# Patient Record
Sex: Male | Born: 1960 | Race: White | Hispanic: No | Marital: Married | Smoking: Current every day smoker
Health system: Southern US, Community
[De-identification: ages and names within clinical notes are randomized; demographics above are authoritative.]

## PROBLEM LIST (undated history)

## (undated) DIAGNOSIS — Z720 Tobacco use: Secondary | ICD-10-CM

## (undated) DIAGNOSIS — I4891 Unspecified atrial fibrillation: Secondary | ICD-10-CM

## (undated) HISTORY — PX: CARDIOVERSION: SHX1299

## (undated) HISTORY — PX: VASECTOMY: SHX75

## (undated) HISTORY — PX: HERNIA REPAIR: SHX51

---

## 2009-01-25 ENCOUNTER — Emergency Department (HOSPITAL_COMMUNITY): Admission: EM | Admit: 2009-01-25 | Discharge: 2009-01-25 | Payer: Self-pay | Admitting: Emergency Medicine

## 2009-01-25 ENCOUNTER — Ambulatory Visit: Payer: Self-pay | Admitting: Diagnostic Radiology

## 2011-03-31 LAB — BASIC METABOLIC PANEL
BUN: 15 mg/dL (ref 6–23)
CO2: 27 mEq/L (ref 19–32)
Chloride: 107 mEq/L (ref 96–112)
Creatinine, Ser: 0.9 mg/dL (ref 0.4–1.5)
Glucose, Bld: 105 mg/dL — ABNORMAL HIGH (ref 70–99)
Potassium: 3.9 mEq/L (ref 3.5–5.1)

## 2011-03-31 LAB — D-DIMER, QUANTITATIVE: D-Dimer, Quant: <0.22 ug/mL-FEU (ref 0.00–0.48)

## 2011-03-31 LAB — DIFFERENTIAL
Basophils Absolute: 0 10*3/uL (ref 0.0–0.1)
Basophils Relative: 1 % (ref 0–1)
Eosinophils Absolute: 0.3 10*3/uL (ref 0.0–0.7)
Eosinophils Relative: 3 % (ref 0–5)
Lymphs Abs: 1.9 10*3/uL (ref 0.7–4.0)
Neutrophils Relative %: 68 % (ref 43–77)

## 2011-03-31 LAB — TSH: TSH: 1.501 u[IU]/mL (ref 0.350–4.500)

## 2011-03-31 LAB — CBC
HCT: 44.6 % (ref 39.0–52.0)
MCHC: 34.4 g/dL (ref 30.0–36.0)
MCV: 94.7 fL (ref 78.0–100.0)
Platelets: 229 10*3/uL (ref 150–400)
RDW: 12.4 % (ref 11.5–15.5)

## 2011-03-31 LAB — TROPONIN I: Troponin I: <0.01 ng/mL (ref 0.00–0.06)

## 2011-03-31 LAB — CK TOTAL AND CKMB (NOT AT ARMC): CK, MB: 1.8 ng/mL (ref 0.3–4.0)

## 2011-03-31 LAB — T3, FREE: T3, Free: 3.6 pg/mL (ref 2.3–4.2)

## 2011-03-31 LAB — POCT CARDIAC MARKERS: Troponin i, poc: <0.05 ng/mL (ref 0.00–0.09)

## 2011-12-16 ENCOUNTER — Encounter: Payer: Self-pay | Admitting: *Deleted

## 2011-12-16 ENCOUNTER — Other Ambulatory Visit: Payer: Self-pay

## 2011-12-16 ENCOUNTER — Emergency Department (HOSPITAL_COMMUNITY)
Admission: EM | Admit: 2011-12-16 | Discharge: 2011-12-16 | Disposition: A | Discharge status: Home / Self Care | Payer: Private Health Insurance - Indemnity | Attending: Emergency Medicine | Admitting: Emergency Medicine

## 2011-12-16 DIAGNOSIS — R002 Palpitations: Secondary | ICD-10-CM

## 2011-12-16 DIAGNOSIS — R000 Tachycardia, unspecified: Secondary | ICD-10-CM | POA: Insufficient documentation

## 2011-12-16 DIAGNOSIS — R0789 Other chest pain: Secondary | ICD-10-CM

## 2011-12-16 DIAGNOSIS — Z720 Tobacco use: Secondary | ICD-10-CM | POA: Diagnosis present

## 2011-12-16 DIAGNOSIS — R079 Chest pain, unspecified: Secondary | ICD-10-CM | POA: Diagnosis present

## 2011-12-16 DIAGNOSIS — I4891 Unspecified atrial fibrillation: Secondary | ICD-10-CM | POA: Diagnosis present

## 2011-12-16 HISTORY — DX: Tobacco use: Z72.0

## 2011-12-16 HISTORY — DX: Unspecified atrial fibrillation: I48.91

## 2011-12-16 LAB — DIFFERENTIAL
Basophils Absolute: 0 10*3/uL (ref 0.0–0.1)
Basophils Relative: 0 % (ref 0–1)
Lymphocytes Relative: 20 % (ref 12–46)
Monocytes Relative: 7 % (ref 3–12)
Neutro Abs: 6.6 10*3/uL (ref 1.7–7.7)
Neutrophils Relative %: 71 % (ref 43–77)

## 2011-12-16 LAB — CBC
Hemoglobin: 14.8 g/dL (ref 13.0–17.0)
MCHC: 35.2 g/dL (ref 30.0–36.0)
WBC: 9.3 10*3/uL (ref 4.0–10.5)

## 2011-12-16 LAB — BASIC METABOLIC PANEL
BUN: 10 mg/dL (ref 6–23)
CO2: 23 mEq/L (ref 19–32)
Chloride: 106 mEq/L (ref 96–112)
GFR calc Af Amer: >90 mL/min (ref >90)
Potassium: 3.7 mEq/L (ref 3.5–5.1)

## 2011-12-16 LAB — CARDIAC PANEL(CRET KIN+CKTOT+MB+TROPI)
Relative Index: 2.5 (ref 0.0–2.5)
Total CK: 102 U/L (ref 7–232)
Troponin I: <0.3 ng/mL (ref <0.30)

## 2011-12-16 MED ORDER — DILTIAZEM HCL 25 MG/5ML IV SOLN
10.0000 mg | Freq: Once | INTRAVENOUS | Status: AC
  Administered 2011-12-16: 10 mg via INTRAVENOUS
  Filled 2011-12-16: qty 5

## 2011-12-16 MED ORDER — RIVAROXABAN 10 MG PO TABS
20.0000 mg | ORAL_TABLET | Freq: Every day | ORAL | Status: DC
  Administered 2011-12-16: 20 mg via ORAL
  Filled 2011-12-16: qty 2

## 2011-12-16 MED ORDER — DILTIAZEM HCL 60 MG PO TABS
60.0000 mg | ORAL_TABLET | Freq: Three times a day (TID) | ORAL | Status: DC
  Administered 2011-12-16: 60 mg via ORAL
  Filled 2011-12-16 (×3): qty 1

## 2011-12-16 MED ORDER — DILTIAZEM HCL 25 MG/5ML IV SOLN
10.0000 mg | Freq: Once | INTRAVENOUS | Status: AC
Start: 2011-12-16 — End: 2011-12-16
  Administered 2011-12-16: 10 mg via INTRAVENOUS
  Filled 2011-12-16: qty 5

## 2011-12-16 MED ORDER — DILTIAZEM HCL 60 MG PO TABS: 60.0000 mg | ORAL_TABLET | Freq: Three times a day (TID) | ORAL | Status: DC

## 2011-12-16 MED ORDER — RIVAROXABAN 20 MG PO TABS: 20.0000 mg | ORAL_TABLET | Freq: Every day | ORAL | Status: DC

## 2011-12-16 NOTE — ED Notes (Signed)
Cardiology at bedside.

## 2011-12-16 NOTE — ED Provider Notes (Signed)
History     CSN: [id number]  Arrival date & time 12/16/11  9147   First MD Initiated Contact with Patient 12/16/11 [phone]      Chief Complaint  Patient presents with  . Chest Pain    (Consider location/radiation/quality/duration/timing/severity/associated sxs/prior treatment) HPI  H.o Afib pw cp and palpitations. Pt c/o intermittent sx x 3 days, worse this morning. Describes sudden onset chest tightness and palpitations. Denies radiation/pain. Denies n/v/diaphoresis. Denies fever/chills/cough. H/O similar requiring cardioversion per patient-- last 2 years ago. Does not take antiarrythmics or coumadin. Similar after eating large loads of sugar, caffeine. Denies chest tightness at this time. Denies h/o VTE in self or family. No recent hosp/surg/immob. No h/o cancer. Denies exogenous hormone use, no leg pain or swelling   Past Medical History  Diagnosis Date  . Campath-induced atrial fibrillation     DCCV 01/2009, not on ASA or antiarrythmic  . Tobacco abuse     Past Surgical History  Procedure Date  . Cardioversion   . Hernia repair     Family History  Problem Relation Age of Onset  . Heart attack Father 84    deceased    History  Substance Use Topics  . Smoking status: Current Everyday Smoker -- 2.0 packs/day    Types: Cigarettes  . Smokeless tobacco: Not on file  . Alcohol Use: 1.2 oz/week    2 Cans of beer per week      Review of Systems  All other systems reviewed and are negative.   except as noted HPI   Allergies  Other  Home Medications   Current Outpatient Rx  Name Route Sig Dispense Refill  . IBUPROFEN 200 MG PO TABS Oral Take 600 mg by mouth every 6 (six) hours as needed. For pain.     Marland Kitchen DILTIAZEM HCL 60 MG PO TABS Oral Take 1 tablet (60 mg total) by mouth every 8 (eight) hours. 90 tablet 0  . RIVAROXABAN 20 MG PO TABS Oral Take 20 mg by mouth daily. 30 tablet 0    BP 119/65  Pulse 122  Temp(Src) 98.8 F (37.1 C) (Oral)  Resp 18  Ht 6\' 2"   (1.88 m)  Wt 204 lb (92.534 kg)  BMI 26.19 kg/m2  SpO2 98%  Physical Exam  Nursing note and vitals reviewed. Constitutional: He is oriented to person, place, and time. He appears well-developed and well-nourished. No distress.  HENT:  Head: Atraumatic.  Mouth/Throat: Oropharynx is clear and moist.  Eyes: Conjunctivae are normal. Pupils are equal, round, and reactive to light.  Neck: Neck supple.  Cardiovascular: Normal heart sounds and intact distal pulses.  Exam reveals no gallop and no friction rub.   No murmur heard.      Tachycardic Irregularly irregular  Pulmonary/Chest: Effort normal. No respiratory distress. He has no wheezes. He has no rales.  Abdominal: Soft. Bowel sounds are normal. There is no tenderness. There is no rebound and no guarding.  Musculoskeletal: Normal range of motion. He exhibits no edema and no tenderness.  Neurological: He is alert and oriented to person, place, and time.  Skin: Skin is warm and dry.  Psychiatric: He has a normal mood and affect.    Date: 12/16/2011  Rate: 148  Rhythm: atrial fibrillation  QRS Axis: normal  Intervals: normal  ST/T Wave abnormalities: normal  Conduction Disutrbances:none  Narrative Interpretation:   Old EKG Reviewed: changes noted  ED Course  Procedures (including critical care time)  Labs Reviewed  BASIC METABOLIC PANEL -  Abnormal; Notable for the following:    Glucose, Bld 103 (*)    All other components within normal limits  CBC  DIFFERENTIAL  CARDIAC PANEL(CRET KIN+CKTOT+MB+TROPI)  MAGNESIUM  PHOSPHORUS  PROTIME-INR  APTT  TSH   No results found.   1. Atrial fibrillation with RVR   2. Chest pain     MDM  H/o Afib pw palpitations and CP. Atrial fibrillation by EKG. Given Diltiazem bolus x 2 with control of HR to 75. Labs reviewed and unremarkable including cardiac enzymes. Discussed with cardiology who will assess the patient in the Emergency department.   Patient continues to deny CP. Rate  ncreased back to 130 in the ED. D/W cardiology. Plan for rate control with PO medication, anticoagulation, and f/u outpatient TEE 2 days if can achieve rate control in the ED.  Patient with rate 106-140s after oral diltiazem. Will give IV diltiazem. Home with Oral dilt and rivaroxaban, outpatient TEE/cardioversion once rate controlled. Patient signed out to Dr. Juleen China.       Forbes Cellar, MD 12/16/11 1606

## 2011-12-16 NOTE — ED Notes (Signed)
Quintin Alto PA at [phone] at [phone].

## 2011-12-16 NOTE — ED Notes (Signed)
Family at bedside. 

## 2011-12-16 NOTE — ED Notes (Signed)
Spoke with Cardiology and EDP.  Cardiology ok to discharge patient EDP ordered one more dose of medication.  Heart rate range 105 - 130.

## 2011-12-16 NOTE — ED Notes (Signed)
Cardiology in room speaking with patient, wife, and daughter.

## 2011-12-16 NOTE — ED Notes (Signed)
MD at bedside. 

## 2011-12-16 NOTE — ED Notes (Signed)
Hr 108 post cardizem

## 2011-12-16 NOTE — ED Notes (Signed)
Heart rate 80 - 110.

## 2011-12-16 NOTE — ED Notes (Signed)
Patient reports he noted onset of tightness in his chest.  He states he did notice his heart beating irregular.  Patient has hx of cardioversion 3 years ago.  He denies sob,  Denies dizziness.

## 2011-12-16 NOTE — ED Notes (Signed)
Notified EDP patient heart rate is between 95-125.

## 2011-12-16 NOTE — ED Notes (Signed)
Patient resting comfortably on stretcher watching TV denies chest pain at this time. Family members at bedside.

## 2011-12-16 NOTE — ED Notes (Signed)
Plan by Cardiology give oral medication and continue to check vital signs. Page Cardiology 1600.

## 2011-12-16 NOTE — H&P (Addendum)
History and Physical  Patient ID: Samuel Herman MRN: [id number], SOB: 08-28-1961 51 y.o. Date of Encounter: 12/16/2011, 11:30 AM  Primary Physician: Rudi Heap MD Primary Cardiologist: Phoebe Sharps MD  Chief Complaint: Chest tightness and palpitations  HPI: 51yom w/ PMHx significant for atrial fibrillation w/ RVR s/p DCCV in 2010 who presented to [facility] on 12/16/11 with complaints of chest tightness and palpitations.  According to the patient, in February 2010 he developed palpitations/irregular heart beat while at work that was associated with chest discomfort and presented to the ED where he was found to be in atrial fibrillation w/ RVR. He was cardioverted with return to sinus rhythm and was placed on 81mg  ASA and metoprolol. He followed up with Dr. Mariah Milling two weeks later and hasn't seen cardiology or taken ASA or metoprolol since that visit.  He reports being under more than usual stress at work and feeling fatigued for the past couple of weeks to months. He was diagnosed and treated for bronchitis one month ago. Monday evening (12/14/11) he went to an oyster cookout and consumed a lot of fried foods. He reports while there he had intermittent episodes of substernal chest tightness and feeling palpitations/irregular heart beat. He also states his face was numb for about 30secs. The chest tightness wasn't made better or worse with a deep breath, there was no radiation and he had no associated shortness of breath, nausea, dizziness, presyncope, or diaphoresis. He went to bed and woke up Tuesday feeling well. Mid morning he felt similar chest tightness and palpitations that were intermittent throughout the day for which he took ASA with some relief. This morning he went to work and continued to feel tired with return of his symptoms so he presented to the Emergency Room. He smokes 2ppd, drinks 8 mountain dews per day, drinks ~2 beers/wk and denies illicit drug use. He does a lot of  walking and climbing stairs at work without chest pain, but does endorse more shortness of breath with running up stairs lately. He denies recent hospitalization, surgery, immobilization, or leg pain/swelling.  In the ED his EKG revealed Atrial Fibrillation w/ RVR 148bpm without ST/T changes. He received Diltiazem 10mg  IV x2 with subsequent heart rates 70s-110s. His chest tightness and palpitations resolved with the rate control and he is currently symptom free. Electrolytes WNL and initial cardiac enzymes negative.  Diagnosis  . Campath-induced atrial fibrillation    Surgical History:  Procedure  . Cardioversion  . Hernia repair    Home Meds: Medication Sig  ibuprofen (ADVIL,MOTRIN) 200 MG tablet Take 600 mg by mouth every 6 (six) hours as needed. For pain.    Allergies:  Allergies  Allergen Reactions  . Other Other (See Comments)    There was an antibiotic that caused hives about 20 years ago.unsure of the name.   Social History  . Marital Status: Married   Lives in [place]    Occupational History  . Tool maker   Social History Main Topics  . Smoking status: Current Everyday Smoker; 2ppd  . Alcohol Use: Yes; 2 beers/wk  . Drug Use: No    Family History: Father died of MI at 5  Review of Systems: General: negative for chills, fever, night sweats or weight changes.  Cardiovascular: As per HPI Dermatological: negative for rash Respiratory: negative for cough or wheezing Urologic: negative for hematuria Abdominal: negative for nausea, vomiting, diarrhea, bright red blood per rectum, melena, or hematemesis Neurologic: negative for visual changes, syncope,  or dizziness All other systems reviewed and are otherwise negative except as noted above.  Labs:   Lab Results  Component Value Date   WBC 9.3 12/16/2011   HGB 14.8 12/16/2011   HCT 42.1 12/16/2011   MCV 91.5 12/16/2011   PLT 201 12/16/2011    Lab 12/16/11 0849  NA 139  K 3.7  CL 106  CO2 23  BUN 10  CREATININE  0.84  CALCIUM 9.6  GLUCOSE 103*    Phosphorus 3.1  Magnesium 1.8   Basename 12/16/11 0849  CKTOTAL 102  CKMB 2.6  TROPONINI <0.30     12/16/2011 08:49  Prothrombin Time 12.9  INR 0.95    Radiology/Studies:  none   EKG: 12/16/11 @ 0842 - Atrial Fibrillation w/ RVR 148bpm, NO ST/T changes  Physical Exam: Blood pressure 101/69, pulse 75, temperature 98.8 F (37.1 C), temperature source Oral, resp. rate 14, height 6\' 2"  (1.88 m), weight 204 lb (92.534 kg), SpO2 100.00%. General: Well developed, well nourished, middle-aged male, in no acute distress. Head: Normocephalic, atraumatic, sclera non-icteric, nares are without discharge Neck: Supple. Negative for carotid bruits. JVD not elevated. Lungs: Clear bilaterally to auscultation without wheezes, rales, or rhonchi. Breathing is unlabored. Heart: Irregularly irregular. No murmurs, rubs, or gallops appreciated. Abdomen: Soft, non-tender, non-distended with normoactive bowel sounds. No hepatomegaly. No rebound/guarding. No obvious abdominal masses. Msk:  Strength and tone appear normal for age. Extremities: No clubbing or cyanosis. No edema.  Distal pedal pulses are 2+ and equal bilaterally. Neuro: Alert and oriented X 3. Moves all extremities spontaneously. Psych:  Responds to questions appropriately with a normal affect.    ASSESSMENT AND PLAN:  51yom w/ PMHx significant for atrial fibrillation w/ RVR s/p DCCV in Feb 2010 who presented to [facility] on 12/16/11 with complaints of chest tightness and palpitations. In the ED his EKG revealed Atrial Fibrillation w/ RVR 148bpm without ST/T changes.   1. Atrial Fibrillation w/ RVR: He has had increased stress as well as recent viral illness in addition to consuming large amounts of caffeine and smoking 2ppd which could have triggered this episode of a.fib. Electrolytes are WNL. He has received two doses of diltiazem with subsequent heart rates in the 70s-110s. CHADSVASC score 0.  -  Add on TSH - Diltiazem 60mg  po Q8H, if rate controlled can go home today, will cont as an outpatient through tomorrow. Hold Friday morning - Xarelto 20mg  daily, first now, continue for a total of 4 weeks - Plan for TEE and DCCV on Friday as an outpatient   2. Chest pain: His EKG is without evidence for acute ischemia and his initial cardiac enzyme is negative. His chest tightness has resolved with rate control of his a.fib. His cardiac risk factors include tobacco abuse and family history. He denies history of HTN, HLD, or DM.  3. Tobacco Abuse: Recommend smoking cessation.  Signed, HOPE, JESSICA PA-C 12/16/2011, 11:30 AM  The patient has a close to 48 hour history of tachycardia palpitations chest pain shortness of breath all of which have been alleviated by rate control despite persistence of atrial fibrillation. His thromboembolic risk profile is probably negative with a chadsVAS score of 0 for long-term anticoagulation even with aspirin is unnecessary. However, given the need for TE cardioversion because of his persistent atrial fibrillation, we will need to undertake preprocedural anticoagulation followed by TE cardioversion in 4 weeks with subsequent and coagulation.  We have discussed alternatives. We will use Rivaroxaban and we will use oral  rate control as he was quite responsive to IV diltiazem we will try it at 60 mg every 8. In the event that we can accomplish rate control orally, we will discharge him later this afternoon. I would not have him take his calcium blocker on the morning of his procedure.    I would not use long-term rate control, either beta blockers or calcium blockers, or aspirin.  He is further advised to stop smoking and to changes not due from caffeinated to decaffeinated.  We'll check a TSH. Left ventricular function process at the time of TEE.

## 2011-12-16 NOTE — ED Notes (Signed)
Pa has seen pt, waiting for Dr. Graciela Husbands to come down for plan of care.

## 2011-12-18 ENCOUNTER — Encounter (HOSPITAL_COMMUNITY)
Admission: RE | Disposition: A | Discharge status: Home / Self Care | Payer: Self-pay | Source: Ambulatory Visit | Attending: Cardiovascular Disease

## 2011-12-18 ENCOUNTER — Ambulatory Visit (HOSPITAL_COMMUNITY)
Admission: RE | Admit: 2011-12-18 | Discharge: 2011-12-18 | Disposition: A | Discharge status: Home / Self Care | Payer: Private Health Insurance - Indemnity | Source: Ambulatory Visit | Attending: Cardiovascular Disease | Admitting: Cardiovascular Disease

## 2011-12-18 ENCOUNTER — Encounter (HOSPITAL_COMMUNITY): Payer: Self-pay | Admitting: Anesthesiology

## 2011-12-18 SURGERY — ECHOCARDIOGRAM, TRANSESOPHAGEAL
Anesthesia: Moderate Sedation

## 2011-12-18 NOTE — Progress Notes (Signed)
   ELECTROPHYSIOLOGY NOTE    Patient Name: Samuel Herman Date of Encounter: 12-18-2011    SUBJECTIVE:Patient arrived today for TEE/DCCV after being on Xarelto and Diltiazem for atrial fibrillation since 12-16-2011.  On arrival, patient found to be in sinus rhythm.  Discussed with Dr Graciela Husbands.  Plan to cancel TEE/DCCV, stop Xarelto and Diltiazem and follow up in 4-6 weeks.    Appointment scheduled with Dr Graciela Husbands for 02-03-12 at [facility] [place] come and discharge patient from endo.  TELEMETRY: Reviewed telemetry pt in sinus rhythm, rate 70's  Signed, Gypsy Balsam RN, BSN 12/18/2011 10:58 AM

## 2011-12-21 ENCOUNTER — Encounter (HOSPITAL_COMMUNITY): Payer: Self-pay | Admitting: Cardiovascular Disease

## 2012-02-02 ENCOUNTER — Encounter: Payer: Self-pay | Admitting: *Deleted

## 2012-02-03 ENCOUNTER — Encounter: Payer: Self-pay | Admitting: Internal Medicine

## 2012-02-03 ENCOUNTER — Ambulatory Visit (INDEPENDENT_AMBULATORY_CARE_PROVIDER_SITE_OTHER): Payer: Private Health Insurance - Indemnity | Admitting: Internal Medicine

## 2012-02-03 ENCOUNTER — Encounter: Payer: Private Health Insurance - Indemnity | Admitting: Internal Medicine

## 2012-02-03 VITALS — BP 109/71 | HR 70 | Resp 18 | Ht 74.0 in | Wt 203.8 lb

## 2012-02-03 DIAGNOSIS — Z720 Tobacco use: Secondary | ICD-10-CM

## 2012-02-03 DIAGNOSIS — I4891 Unspecified atrial fibrillation: Secondary | ICD-10-CM

## 2012-02-03 DIAGNOSIS — F17200 Nicotine dependence, unspecified, uncomplicated: Secondary | ICD-10-CM

## 2012-02-03 DIAGNOSIS — F439 Reaction to severe stress, unspecified: Secondary | ICD-10-CM

## 2012-02-03 DIAGNOSIS — Z733 Stress, not elsewhere classified: Secondary | ICD-10-CM

## 2012-02-03 NOTE — Progress Notes (Signed)
  HPI  Samuel Herman is a 51 y.o. male See in followup for atrial fibrillation. This was first identified in 2010 at which time he went cardioversion. Had recurrent atrial fibrillation in early January of this year in the context of an antecedent illness and caffeine. He had persistence of atrial fibrillation  He underwent TE guided cardioversion with post procedural anticoagulation. He has been maintaining sinus rhythm.  CHADS-VASc score is 0  Visit he brought up the issue of stress. In taking stress home to his wife. And I raised the question as to whether he considered himself an angry man. We discussed whether he had nocturnal sleep disturbance. He discussed how stressful work is. Past Medical History  Diagnosis Date  . Atrial fibrillation     DCCV 01/2009, not on ASA or antiarrythmic  . Tobacco abuse     Past Surgical History  Procedure Date  . Cardioversion   . Hernia repair   . Tee without cardioversion 12/18/2011    Procedure: TRANSESOPHAGEAL ECHOCARDIOGRAM (TEE);  Surgeon: Wendall Stade, MD;  Location: [facility] ENDOSCOPY;  Service: Cardiovascular;  Laterality: N/A;  . Cardioversion 12/18/2011    Procedure: CARDIOVERSION;  Surgeon: Wendall Stade, MD;  Location: [facility] ENDOSCOPY;  Service: Cardiovascular;  Laterality: N/A;    Current Outpatient Prescriptions  Medication Sig Dispense Refill  . ibuprofen (ADVIL,MOTRIN) 200 MG tablet Take 600 mg by mouth every 6 (six) hours as needed. For pain.         Allergies  Allergen Reactions  . Other Other (See Comments)    There was an antibiotic that caused hives about 20 years ago.unsure of the name.    Review of Systems negative except from HPI and PMH  Physical Exam There were no vitals taken for this visit. Well developed and well nourished in no acute distress HENT normal E scleral and icterus clear Neck Supple JVP flat; carotids brisk and full Clear to ausculation Regular rate and rhythm, no murmurs gallops or rub Soft with  active bowel sounds No clubbing cyanosis none Edema Alert and oriented, grossly normal motor and sensory function Skin Warm and Dry  Sinus rhythm at 68 Intervals 0.15/0.09/0.39 Axis is 55   Assessment and  Plan

## 2012-02-03 NOTE — Assessment & Plan Note (Signed)
He asked for nicotine patches. I told him I would prescribe him. He left without the prescription.

## 2012-02-03 NOTE — Assessment & Plan Note (Signed)
We had a 30 minute discussion regarding stress and its relationship to anxiety anger and depression. I suggested that he discuss with his family whether these are issues and if so to pursue relief in the form of talking with his PCP regarding antidepressants and considering some counseling.

## 2012-02-03 NOTE — Assessment & Plan Note (Signed)
Holding sinus rhythm. CHADS-VASc score 0 no anticoagulation

## 2014-05-17 ENCOUNTER — Encounter: Payer: Self-pay | Admitting: Family Medicine

## 2014-05-17 ENCOUNTER — Ambulatory Visit (INDEPENDENT_AMBULATORY_CARE_PROVIDER_SITE_OTHER): Payer: BC Managed Care – HMO | Admitting: Family Medicine

## 2014-05-17 VITALS — BP 113/65 | HR 70 | Temp 98.7°F | Ht 72.0 in | Wt 205.4 lb

## 2014-05-17 DIAGNOSIS — M25512 Pain in left shoulder: Secondary | ICD-10-CM

## 2014-05-17 DIAGNOSIS — M25519 Pain in unspecified shoulder: Secondary | ICD-10-CM

## 2014-05-17 MED ORDER — NAPROXEN 500 MG PO TABS: 500.0000 mg | ORAL_TABLET | Freq: Two times a day (BID) | ORAL | Status: DC

## 2014-05-17 MED ORDER — HYDROCODONE-ACETAMINOPHEN 5-325 MG PO TABS: 1.0000 | ORAL_TABLET | Freq: Four times a day (QID) | ORAL | Status: DC | PRN

## 2014-05-17 NOTE — Progress Notes (Signed)
   Subjective:    Patient ID: Samuel Herman, male    DOB: 10/22/1961, 53 y.o.   MRN: [id number]  HPI This 53 y.o. male presents for evaluation of left upper back discomfort.  He states his wife has massaged this area and it has helped some.  He c/o moderate pain in his left mid back radiating up to His neck and he did this lifting and pulling.  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   Review of Systems C/o neck and back pain No chest pain, SOB, HA, dizziness, vision change, N/V, diarrhea, constipation, dysuria, urinary urgency or frequency or rash.     Objective:   Physical Exam  Vital signs noted  Well developed well nourished male.  HEENT - Head atraumatic Normocephalic Respiratory - Lungs CTA bilateral Cardiac - RRR S1 and S2 without murmur GI - Abdomen soft Nontender and bowel sounds active x 4 MS - TTP left scapula region to left trapezius      Assessment & Plan:  Trigger point of left shoulder region - Plan: naproxen (NAPROSYN) 500 MG tablet, HYDROcodone-acetaminophen (NORCO) 5-325 MG per tablet,  Apply heating pad and follow up prn if not better.  Deatra Canter FNP

## 2014-06-13 ENCOUNTER — Encounter: Payer: Self-pay | Admitting: Physician Assistant

## 2014-06-13 ENCOUNTER — Encounter (INDEPENDENT_AMBULATORY_CARE_PROVIDER_SITE_OTHER): Payer: Self-pay

## 2014-06-13 ENCOUNTER — Ambulatory Visit (INDEPENDENT_AMBULATORY_CARE_PROVIDER_SITE_OTHER): Payer: BC Managed Care – HMO | Admitting: Physician Assistant

## 2014-06-13 VITALS — BP 112/78 | HR 76 | Temp 98.3°F | Ht 73.0 in | Wt 201.4 lb

## 2014-06-13 DIAGNOSIS — J40 Bronchitis, not specified as acute or chronic: Secondary | ICD-10-CM

## 2014-06-13 MED ORDER — AZITHROMYCIN 250 MG PO TABS: ORAL_TABLET | ORAL | Status: DC

## 2014-06-13 NOTE — Progress Notes (Signed)
Subjective:     Patient ID: Samuel Herman, male   DOB: Oct 27, 1961, 53 y.o.   MRN: [id number]  HPI Pt with continued prod cough and congestion Has used multiple OTC meds but sx cont  Review of Systems  Constitutional: Positive for chills, appetite change and fatigue.  HENT: Positive for congestion, postnasal drip, rhinorrhea, sinus pressure and sore throat. Negative for sneezing.   Eyes: Negative.   Respiratory: Positive for cough, chest tightness and wheezing. Negative for apnea, choking, shortness of breath and stridor.   Cardiovascular: Negative.        Objective:   Physical Exam  Nursing note and vitals reviewed. Constitutional: He appears well-developed and well-nourished.  HENT:  Right Ear: External ear normal.  Left Ear: External ear normal.  Mouth/Throat: Oropharynx is clear and moist.  Neck: Normal range of motion. Neck supple. No JVD present.  Cardiovascular: Normal rate, regular rhythm and normal heart sounds.   Pulmonary/Chest: Effort normal. He has wheezes.  Lymphadenopathy:    He has no cervical adenopathy.       Assessment:     Bronchitis    Plan:     Smoking cessation Fluids Rest Zpack OTC meds for congestion F/U prn

## 2014-06-13 NOTE — Patient Instructions (Signed)
Smoking Cessation Quitting smoking is important to your health and has many advantages. However, it is not always easy to quit since nicotine is a very addictive drug. Often times, people try 3 times or more before being able to quit. This document explains the best ways for you to prepare to quit smoking. Quitting takes hard work and a lot of effort, but you can do it. ADVANTAGES OF QUITTING SMOKING  You will live longer, feel better, and live better.  Your body will feel the impact of quitting smoking almost immediately.  Within 20 minutes, blood pressure decreases. Your pulse returns to its normal level.  After 8 hours, carbon monoxide levels in the blood return to normal. Your oxygen level increases.  After 24 hours, the chance of having a heart attack starts to decrease. Your breath, hair, and body stop smelling like smoke.  After 48 hours, damaged nerve endings begin to recover. Your sense of taste and smell improve.  After 72 hours, the body is virtually free of nicotine. Your bronchial tubes relax and breathing becomes easier.  After 2 to 12 weeks, lungs can hold more air. Exercise becomes easier and circulation improves.  The risk of having a heart attack, stroke, cancer, or lung disease is greatly reduced.  After 1 year, the risk of coronary heart disease is cut in half.  After 5 years, the risk of stroke falls to the same as a nonsmoker.  After 10 years, the risk of lung cancer is cut in half and the risk of other cancers decreases significantly.  After 15 years, the risk of coronary heart disease drops, usually to the level of a nonsmoker.  If you are pregnant, quitting smoking will improve your chances of having a healthy baby.  The people you live with, especially any children, will be healthier.  You will have extra money to spend on things other than cigarettes. QUESTIONS TO THINK ABOUT BEFORE ATTEMPTING TO QUIT You may want to talk about your answers with your  caregiver.  Why do you want to quit?  If you tried to quit in the past, what helped and what did not?  What will be the most difficult situations for you after you quit? How will you plan to handle them?  Who can help you through the tough times? Your family? Friends? A caregiver?  What pleasures do you get from smoking? What ways can you still get pleasure if you quit? Here are some questions to ask your caregiver:  How can you help me to be successful at quitting?  What medicine do you think would be best for me and how should I take it?  What should I do if I need more help?  What is smoking withdrawal like? How can I get information on withdrawal? GET READY  Set a quit date.  Change your environment by getting rid of all cigarettes, ashtrays, matches, and lighters in your home, car, or work. Do not let people smoke in your home.  Review your past attempts to quit. Think about what worked and what did not. GET SUPPORT AND ENCOURAGEMENT You have a better chance of being successful if you have help. You can get support in many ways.  Tell your family, friends, and co-workers that you are going to quit and need their support. Ask them not to smoke around you.  Get individual, group, or telephone counseling and support. Programs are available at local hospitals and health centers. Call your local health department for   information about programs in your area.  Spiritual beliefs and practices may help some smokers quit.  Download a "quit meter" on your computer to keep track of quit statistics, such as how long you have gone without smoking, cigarettes not smoked, and money saved.  Get a self-help book about quitting smoking and staying off of tobacco. LEARN NEW SKILLS AND BEHAVIORS  Distract yourself from urges to smoke. Talk to someone, go for a walk, or occupy your time with a task.  Change your normal routine. Take a different route to work. Drink tea instead of coffee.  Eat breakfast in a different place.  Reduce your stress. Take a hot bath, exercise, or read a book.  Plan something enjoyable to do every day. Reward yourself for not smoking.  Explore interactive web-based programs that specialize in helping you quit. GET MEDICINE AND USE IT CORRECTLY Medicines can help you stop smoking and decrease the urge to smoke. Combining medicine with the above behavioral methods and support can greatly increase your chances of successfully quitting smoking.  Nicotine replacement therapy helps deliver nicotine to your body without the negative effects and risks of smoking. Nicotine replacement therapy includes nicotine gum, lozenges, inhalers, nasal sprays, and skin patches. Some may be available over-the-counter and others require a prescription.  Antidepressant medicine helps people abstain from smoking, but how this works is unknown. This medicine is available by prescription.  Nicotinic receptor partial agonist medicine simulates the effect of nicotine in your brain. This medicine is available by prescription. Ask your caregiver for advice about which medicines to use and how to use them based on your health history. Your caregiver will tell you what side effects to look out for if you choose to be on a medicine or therapy. Carefully read the information on the package. Do not use any other product containing nicotine while using a nicotine replacement product.  RELAPSE OR DIFFICULT SITUATIONS Most relapses occur within the first 3 months after quitting. Do not be discouraged if you start smoking again. Remember, most people try several times before finally quitting. You may have symptoms of withdrawal because your body is used to nicotine. You may crave cigarettes, be irritable, feel very hungry, cough often, get headaches, or have difficulty concentrating. The withdrawal symptoms are only temporary. They are strongest when you first quit, but they will go away within  10-14 days. To reduce the chances of relapse, try to:  Avoid drinking alcohol. Drinking lowers your chances of successfully quitting.  Reduce the amount of caffeine you consume. Once you quit smoking, the amount of caffeine in your body increases and can give you symptoms, such as a rapid heartbeat, sweating, and anxiety.  Avoid smokers because they can make you want to smoke.  Do not let weight gain distract you. Many smokers will gain weight when they quit, usually less than 10 pounds. Eat a healthy diet and stay active. You can always lose the weight gained after you quit.  Find ways to improve your mood other than smoking. FOR MORE INFORMATION  www.smokefree.gov  Document Released: 11/24/2001 Document Revised: 05/31/2012 Document Reviewed: 03/10/2012 ExitCare Patient Information 2015 [employer]. This information is not intended to replace advice given to you by your health care provider. Make sure you discuss any questions you have with your health care provider.  

## 2014-09-07 ENCOUNTER — Telehealth: Payer: Self-pay | Admitting: Family Medicine

## 2014-09-07 NOTE — Telephone Encounter (Signed)
appt given for in the am  

## 2014-09-08 ENCOUNTER — Ambulatory Visit (INDEPENDENT_AMBULATORY_CARE_PROVIDER_SITE_OTHER): Payer: BC Managed Care – HMO | Admitting: Family Medicine

## 2014-09-08 VITALS — BP 115/71 | HR 77 | Temp 97.0°F | Ht 73.0 in | Wt 205.8 lb

## 2014-09-08 DIAGNOSIS — H02829 Cysts of unspecified eye, unspecified eyelid: Secondary | ICD-10-CM

## 2014-09-08 DIAGNOSIS — H02822 Cysts of right lower eyelid: Secondary | ICD-10-CM

## 2014-09-08 DIAGNOSIS — G501 Atypical facial pain: Secondary | ICD-10-CM

## 2014-09-08 MED ORDER — DOXYCYCLINE HYCLATE 100 MG PO TABS: 100.0000 mg | ORAL_TABLET | Freq: Two times a day (BID) | ORAL | Status: DC

## 2014-09-08 NOTE — Progress Notes (Signed)
   Subjective:    Patient ID: Samuel Herman, male    DOB: 1961-11-01, 53 y.o.   MRN: [id number]  HPI C/o cyst that is painful under his right eye.  He has had similar cysts on his right cheek that he had removed by a surgeon across from [facility]. He states he wants the cyst to be popped today because it is painful.   Review of Systems C/o cyst below right eye. No chest pain, SOB, HA, dizziness, vision change, N/V, diarrhea, constipation, dysuria, urinary urgency or frequency, myalgias, arthralgias or rash.     Objective:   Physical Exam  Vital signs noted  Well developed well nourished male.  HEENT - Head atraumatic Normocephalic                Eyes - right peri orbital area with cyst right lower eye area that is erythematous and tender. Respiratory - Lungs CTA bilateral Cardiac - RRR S1 and S2 without murmur GI - Abdomen soft Nontender and bowel sounds active x 4 Extremities - No edema. Neuro - Grossly intact.  Procedure - cyst prepped with etoh and then #18 needle is used to stab cyst and purulent serous sanguin drainage drained and then small cyst remains.  Patient states pain is gone.    Assessment & Plan:  Cyst of right lower eyelid - Plan: Ambulatory referral to General Surgery Doxycycline  one po bid x 10 days Simple incision and drainage procedure to relieve pain accomplished  Deatra Canter FNP

## 2014-09-13 ENCOUNTER — Telehealth: Payer: Self-pay | Admitting: Family Medicine

## 2014-10-01 ENCOUNTER — Telehealth: Payer: Self-pay

## 2014-10-01 NOTE — Telephone Encounter (Signed)
Wants an ENT referral

## 2014-10-02 ENCOUNTER — Other Ambulatory Visit: Payer: Self-pay | Admitting: Family Medicine

## 2014-10-02 DIAGNOSIS — J0100 Acute maxillary sinusitis, unspecified: Secondary | ICD-10-CM

## 2014-10-05 ENCOUNTER — Other Ambulatory Visit: Payer: Self-pay | Admitting: Family Medicine

## 2014-12-11 ENCOUNTER — Ambulatory Visit (INDEPENDENT_AMBULATORY_CARE_PROVIDER_SITE_OTHER): Payer: BC Managed Care – HMO | Admitting: Physician Assistant

## 2014-12-11 ENCOUNTER — Encounter: Payer: Self-pay | Admitting: Physician Assistant

## 2014-12-11 VITALS — BP 117/74 | HR 70 | Temp 97.1°F | Ht 73.0 in | Wt 205.8 lb

## 2014-12-11 DIAGNOSIS — J209 Acute bronchitis, unspecified: Secondary | ICD-10-CM

## 2014-12-11 MED ORDER — ALBUTEROL SULFATE HFA 108 (90 BASE) MCG/ACT IN AERS: 2.0000 | INHALATION_SPRAY | Freq: Four times a day (QID) | RESPIRATORY_TRACT | Status: DC | PRN

## 2014-12-11 MED ORDER — AZITHROMYCIN 250 MG PO TABS: ORAL_TABLET | ORAL | Status: DC

## 2014-12-11 MED ORDER — METHYLPREDNISOLONE ACETATE 80 MG/ML IJ SUSP
80.0000 mg | Freq: Once | INTRAMUSCULAR | Status: AC
  Administered 2014-12-11: 80 mg via INTRAMUSCULAR

## 2014-12-11 NOTE — Patient Instructions (Signed)

## 2014-12-11 NOTE — Progress Notes (Signed)
Subjective:     Patient ID: Samuel Herman, male   DOB: 1961/02/29, 53 y.o.   MRN: [id number]  HPI Pt with cough, congestion, and wheezing for several day + smoking hx  Review of Systems  Constitutional: Positive for fever, chills, activity change, appetite change and fatigue.  HENT: Positive for congestion, postnasal drip and sinus pressure.   Respiratory: Positive for choking and wheezing.        Objective:   Physical Exam  Constitutional: He appears well-developed and well-nourished.  HENT:  Right Ear: External ear normal.  Left Ear: External ear normal.  Mouth/Throat: Oropharynx is clear and moist. No oropharyngeal exudate.  Neck: Neck supple.  Cardiovascular: Normal rate, regular rhythm and normal heart sounds.   No murmur heard. Pulmonary/Chest: Effort normal. He has wheezes.  Lymphadenopathy:    He has no cervical adenopathy.  Nursing note and vitals reviewed.      Assessment:     Bronchitis    Plan:     Stressed smoking cessation Fluids Rest Zpack pack Alb Inhaler OTC meds for sx  F/U prn

## 2014-12-27 ENCOUNTER — Encounter (HOSPITAL_COMMUNITY): Payer: Self-pay | Admitting: Cardiovascular Disease

## 2015-04-16 ENCOUNTER — Encounter: Payer: Self-pay | Admitting: Nurse Practitioner

## 2015-04-16 ENCOUNTER — Ambulatory Visit (INDEPENDENT_AMBULATORY_CARE_PROVIDER_SITE_OTHER): Payer: BLUE CROSS/BLUE SHIELD | Admitting: Nurse Practitioner

## 2015-04-16 VITALS — BP 113/78 | HR 77 | Temp 98.3°F | Ht 73.0 in | Wt 202.0 lb

## 2015-04-16 DIAGNOSIS — R5383 Other fatigue: Secondary | ICD-10-CM

## 2015-04-16 DIAGNOSIS — T148 Other injury of unspecified body region: Secondary | ICD-10-CM | POA: Diagnosis not present

## 2015-04-16 DIAGNOSIS — W57XXXA Bitten or stung by nonvenomous insect and other nonvenomous arthropods, initial encounter: Secondary | ICD-10-CM

## 2015-04-16 MED ORDER — DOXYCYCLINE HYCLATE 100 MG PO TABS: 100.0000 mg | ORAL_TABLET | Freq: Two times a day (BID) | ORAL | Status: DC

## 2015-04-16 NOTE — Progress Notes (Signed)
   Subjective:    Patient ID: Samuel Herman, male    DOB: Nov 10, 1961, 54 y.o.   MRN: [id number]  HPI Patient comes in with his wife c/o fatigue that started several days ago- he is under more stress at work but does not feel that that is the cause. He is usually very active when he comes home from work but last several days he has no energy to do anything. He has a history of atrial fib but that was several years ago. He does say that he has gotten several ticks off of himself over the last several weeks.    Review of Systems  Constitutional: Positive for fatigue. Negative for fever, chills and appetite change.  HENT: Negative.   Respiratory: Negative.   Cardiovascular: Negative for chest pain, palpitations and leg swelling.  Genitourinary: Negative.   Neurological: Negative.   Psychiatric/Behavioral: Negative.        Objective:   Physical Exam  Constitutional: He is oriented to person, place, and time. He appears well-developed and well-nourished. No distress.  HENT:  Right Ear: External ear normal.  Left Ear: External ear normal.  Nose: Nose normal.  Mouth/Throat: Oropharynx is clear and moist.  Eyes: Pupils are equal, round, and reactive to light.  Neck: Normal range of motion. Neck supple.  Cardiovascular: Normal rate, regular rhythm and normal heart sounds.   Pulmonary/Chest: Effort normal and breath sounds normal.  Neurological: He is alert and oriented to person, place, and time.  Skin: Skin is warm and dry.  Psychiatric: He has a normal mood and affect. His behavior is normal. Judgment and thought content normal.   BP 113/78 mmHg  Pulse 77  Temp(Src) 98.3 F (36.8 C) (Oral)  Ht 6\' 1"  (1.854 m)  Wt 202 lb (91.627 kg)  BMI 26.66 kg/m2        Assessment & Plan:   1. Other fatigue   2. Tick bite    Meds ordered this encounter  Medications  . doxycycline (VIBRA-TABS) 100 MG tablet    Sig: Take 1 tablet (100 mg total) by mouth 2 (two) times daily. 1 po bid      Dispense:  28 tablet    Refill:  0    Order Specific Question:  Supervising Provider    Answer:  Ernestina PennaMOORE, DONALD W [[id number]]   Labs pending If does not improve will run more test  Mary-Margaret Daphine DeutscherMartin, FNP

## 2015-04-16 NOTE — Patient Instructions (Signed)
Tick Bite Information Ticks are insects that attach themselves to the skin and draw blood for food. There are various types of ticks. Common types include wood ticks and deer ticks. Most ticks live in shrubs and grassy areas. Ticks can climb onto your body when you make contact with leaves or grass where the tick is waiting. The most common places on the body for ticks to attach themselves are the scalp, neck, armpits, waist, and groin. Most tick bites are harmless, but sometimes ticks carry germs that cause diseases. These germs can be spread to a person during the tick's feeding process. The chance of a disease spreading through a tick bite depends on:   The type of tick.  Time of year.   How long the tick is attached.   Geographic location.  HOW CAN YOU PREVENT TICK BITES? Take these steps to help prevent tick bites when you are outdoors:  Wear protective clothing. Long sleeves and long pants are best.   Wear white clothes so you can see ticks more easily.  Tuck your pant legs into your socks.   If walking on a trail, stay in the middle of the trail to avoid brushing against bushes.  Avoid walking through areas with long grass.  Put insect repellent on all exposed skin and along boot tops, pant legs, and sleeve cuffs.   Check clothing, hair, and skin repeatedly and before going inside.   Brush off any ticks that are not attached.  Take a shower or bath as soon as possible after being outdoors.  WHAT IS THE PROPER WAY TO REMOVE A TICK? Ticks should be removed as soon as possible to help prevent diseases caused by tick bites. 1. If latex gloves are available, put them on before trying to remove a tick.  2. Using fine-point tweezers, grasp the tick as close to the skin as possible. You may also use curved forceps or a tick removal tool. Grasp the tick as close to its head as possible. Avoid grasping the tick on its body. 3. Pull gently with steady upward pressure until  the tick lets go. Do not twist the tick or jerk it suddenly. This may break off the tick's head or mouth parts. 4. Do not squeeze or crush the tick's body. This could force disease-carrying fluids from the tick into your body.  5. After the tick is removed, wash the bite area and your hands with soap and water or other disinfectant such as alcohol. 6. Apply a small amount of antiseptic cream or ointment to the bite site.  7. Wash and disinfect any instruments that were used.  Do not try to remove a tick by applying a hot match, petroleum jelly, or fingernail polish to the tick. These methods do not work and may increase the chances of disease being spread from the tick bite.  WHEN SHOULD YOU SEEK MEDICAL CARE? Contact your health care provider if you are unable to remove a tick from your skin or if a part of the tick breaks off and is stuck in the skin.  After a tick bite, you need to be aware of signs and symptoms that could be related to diseases spread by ticks. Contact your health care provider if you develop any of the following in the days or weeks after the tick bite:  Unexplained fever.  Rash. A circular rash that appears days or weeks after the tick bite may indicate the possibility of Lyme disease. The rash may resemble   a target with a bull's-eye and may occur at a different part of your body than the tick bite.  Redness and swelling in the area of the tick bite.   Tender, swollen lymph glands.   Diarrhea.   Weight loss.   Cough.   Fatigue.   Muscle, joint, or bone pain.   Abdominal pain.   Headache.   Lethargy or a change in your level of consciousness.  Difficulty walking or moving your legs.   Numbness in the legs.   Paralysis.  Shortness of breath.   Confusion.   Repeated vomiting.  Document Released: 11/27/2000 Document Revised: 09/20/2013 Document Reviewed: 05/10/2013 ExitCare Patient Information 2015 [employer]. This information is  not intended to replace advice given to you by your health care provider. Make sure you discuss any questions you have with your health care provider.  

## 2015-04-19 ENCOUNTER — Telehealth: Payer: Self-pay | Admitting: Family Medicine

## 2015-04-19 LAB — LYME AB/WESTERN BLOT REFLEX
LYME DISEASE AB, QUANT, IGM: <0.8 index (ref 0.00–0.79)
Lyme IgG/IgM Ab: <0.91 {ISR} (ref 0.00–0.90)

## 2015-04-19 LAB — ROCKY MTN SPOTTED FVR ABS PNL(IGG+IGM)
RMSF IgG: POSITIVE — AB
RMSF IgM: 0.27 index (ref 0.00–0.89)

## 2015-04-19 LAB — RMSF, IGG, IFA

## 2015-04-19 NOTE — Telephone Encounter (Signed)
Patient aware results are still not back.

## 2015-04-24 ENCOUNTER — Other Ambulatory Visit: Payer: Self-pay | Admitting: *Deleted

## 2015-04-24 DIAGNOSIS — A770 Spotted fever due to Rickettsia rickettsii: Secondary | ICD-10-CM

## 2015-04-30 ENCOUNTER — Other Ambulatory Visit (INDEPENDENT_AMBULATORY_CARE_PROVIDER_SITE_OTHER): Payer: BLUE CROSS/BLUE SHIELD

## 2015-04-30 DIAGNOSIS — A770 Spotted fever due to Rickettsia rickettsii: Secondary | ICD-10-CM

## 2015-04-30 NOTE — Progress Notes (Signed)
Lab only 

## 2015-05-03 LAB — ROCKY MTN SPOTTED FVR ABS PNL(IGG+IGM)
RMSF IGG: POSITIVE — AB
RMSF IGM: 0.25 {index} (ref 0.00–0.89)

## 2015-05-03 LAB — RMSF, IGG, IFA: RMSF, IGG, IFA: 1:128 {titer} — ABNORMAL HIGH

## 2015-05-07 ENCOUNTER — Telehealth: Payer: Self-pay | Admitting: Family Medicine

## 2015-05-07 ENCOUNTER — Telehealth: Payer: Self-pay | Admitting: Physician Assistant

## 2015-05-07 NOTE — Telephone Encounter (Signed)
Spoke with patient.

## 2015-10-16 ENCOUNTER — Encounter: Payer: Self-pay | Admitting: Family Medicine

## 2015-10-16 ENCOUNTER — Ambulatory Visit (INDEPENDENT_AMBULATORY_CARE_PROVIDER_SITE_OTHER): Payer: BLUE CROSS/BLUE SHIELD | Admitting: Family Medicine

## 2015-10-16 VITALS — BP 124/80 | HR 82 | Temp 97.6°F | Ht 73.0 in | Wt 197.0 lb

## 2015-10-16 DIAGNOSIS — Z0000 Encounter for general adult medical examination without abnormal findings: Secondary | ICD-10-CM

## 2015-10-16 DIAGNOSIS — Z13220 Encounter for screening for lipoid disorders: Secondary | ICD-10-CM

## 2015-10-16 DIAGNOSIS — Z131 Encounter for screening for diabetes mellitus: Secondary | ICD-10-CM

## 2015-10-16 DIAGNOSIS — Z125 Encounter for screening for malignant neoplasm of prostate: Secondary | ICD-10-CM

## 2015-10-17 NOTE — Progress Notes (Signed)
BP 124/80 mmHg  Pulse 82  Temp(Src) 97.6 F (36.4 C) (Oral)  Ht 6' 1"  (1.854 m)  Wt 197 lb (89.359 kg)  BMI 26.00 kg/m2   Subjective:    Patient ID: Samuel Herman, male    DOB: [birth year], 54 y.o.   MRN: [id number]  HPI: Samuel Herman is a 54 y.o. male presenting on 10/16/2015 for Wellness fitness exam physical   HPI  Well exam Patient comes in today for routine well adult exam. Patient denies headaches, blurred vision, chest pains, shortness of breath, or weakness. Denies any issues with bowel or bladder problems.  Wants to do screening labs.  Relevant past medical, surgical, family and social history reviewed and updated as indicated. Interim medical history since our last visit reviewed. Allergies and medications reviewed and updated.  Review of Systems  Constitutional: Negative for fever and chills.  HENT: Negative for congestion, ear discharge and ear pain.   Eyes: Negative for discharge and visual disturbance.  Respiratory: Negative for chest tightness, shortness of breath and wheezing.   Cardiovascular: Negative for chest pain, palpitations and leg swelling.  Gastrointestinal: Negative for abdominal pain, diarrhea and constipation.  Genitourinary: Negative for difficulty urinating.  Musculoskeletal: Negative for back pain and gait problem.  Skin: Negative for rash.  Neurological: Negative for dizziness, syncope, light-headedness and headaches.  All other systems reviewed and are negative.   Per HPI unless specifically indicated above     Medication List    Notice  As of 10/16/2015 11:59 PM   You have not been prescribed any medications.         Objective:    BP 124/80 mmHg  Pulse 82  Temp(Src) 97.6 F (36.4 C) (Oral)  Ht 6' 1"  (1.854 m)  Wt 197 lb (89.359 kg)  BMI 26.00 kg/m2  Wt Readings from Last 3 Encounters:  10/16/15 197 lb (89.359 kg)  04/16/15 202 lb (91.627 kg)  12/11/14 205 lb 12.8 oz (93.35 kg)    Physical Exam  Constitutional: He  is oriented to person, place, and time. He appears well-developed and well-nourished. No distress.  HENT:  Right Ear: External ear normal.  Left Ear: External ear normal.  Nose: Nose normal.  Mouth/Throat: Oropharynx is clear and moist. No oropharyngeal exudate.  Eyes: Conjunctivae and EOM are normal. Pupils are equal, round, and reactive to light. Right eye exhibits no discharge. No scleral icterus.  Neck: Neck supple. No thyromegaly present.  Cardiovascular: Normal rate, regular rhythm, normal heart sounds and intact distal pulses.   No murmur heard. Pulmonary/Chest: Effort normal and breath sounds normal. No respiratory distress. He has no wheezes.  Abdominal: Soft. Bowel sounds are normal. He exhibits no distension. There is no tenderness. There is no rebound.  Genitourinary: Rectum normal and prostate normal.  Musculoskeletal: Normal range of motion. He exhibits no edema.  Lymphadenopathy:    He has no cervical adenopathy.  Neurological: He is alert and oriented to person, place, and time. Coordination normal.  Skin: Skin is warm and dry. No rash noted. He is not diaphoretic.  Psychiatric: He has a normal mood and affect. His behavior is normal.  Vitals reviewed.   Results for orders placed or performed in visit on 04/30/15  Rocky mtn spotted fvr abs pnl(IgG+IgM)  Result Value Ref Range   RMSF IgG Positive (A) Negative   RMSF IgM 0.25 0.00 - 0.89 index  RMSF, IgG, IFA  Result Value Ref Range   RMSF, IGG, IFA 1:128 (H) Neg <1:64  Assessment & Plan:   Problem List Items Addressed This Visit    None    Visit Diagnoses    Well adult exam    -  Primary    Screening, lipid        Relevant Orders    Lipid panel    Screening for diabetes mellitus        Relevant Orders    CMP14+EGFR    Screening for prostate cancer        Relevant Orders    PSA, total and free        Follow up plan: Return in about 1 year (around 10/15/2016), or if symptoms worsen or fail to  improve.  Caryl Pina, MD [facility] 10/16/2015, 5:02 PM

## 2015-11-08 ENCOUNTER — Other Ambulatory Visit (INDEPENDENT_AMBULATORY_CARE_PROVIDER_SITE_OTHER): Payer: BLUE CROSS/BLUE SHIELD

## 2015-11-08 DIAGNOSIS — Z131 Encounter for screening for diabetes mellitus: Secondary | ICD-10-CM

## 2015-11-08 DIAGNOSIS — Z125 Encounter for screening for malignant neoplasm of prostate: Secondary | ICD-10-CM

## 2015-11-08 DIAGNOSIS — Z13220 Encounter for screening for lipoid disorders: Secondary | ICD-10-CM

## 2015-11-08 NOTE — Progress Notes (Signed)
Lab only 

## 2015-11-09 LAB — PSA, TOTAL AND FREE
PROSTATE SPECIFIC AG, SERUM: 0.7 ng/mL (ref 0.0–4.0)
PSA FREE: 0.42 ng/mL
PSA, Free Pct: 60 %

## 2015-11-09 LAB — CMP14+EGFR
A/G RATIO: 1.7 (ref 1.1–2.5)
ALT: 17 IU/L (ref 0–44)
AST: 20 IU/L (ref 0–40)
Albumin: 4.2 g/dL (ref 3.5–5.5)
Alkaline Phosphatase: 52 IU/L (ref 39–117)
BUN / CREAT RATIO: 16 (ref 9–20)
BUN: 14 mg/dL (ref 6–24)
Bilirubin Total: 0.5 mg/dL (ref 0.0–1.2)
CALCIUM: 9.5 mg/dL (ref 8.7–10.2)
CO2: 23 mmol/L (ref 18–29)
Chloride: 104 mmol/L (ref 97–106)
Creatinine, Ser: 0.9 mg/dL (ref 0.76–1.27)
GFR, EST AFRICAN AMERICAN: 111 mL/min/{1.73_m2} (ref >59)
GFR, EST NON AFRICAN AMERICAN: 96 mL/min/{1.73_m2} (ref >59)
GLUCOSE: 98 mg/dL (ref 65–99)
Globulin, Total: 2.5 g/dL (ref 1.5–4.5)
POTASSIUM: 4.3 mmol/L (ref 3.5–5.2)
Sodium: 142 mmol/L (ref 136–144)
TOTAL PROTEIN: 6.7 g/dL (ref 6.0–8.5)

## 2015-11-09 LAB — LIPID PANEL
CHOL/HDL RATIO: 3.4 ratio (ref 0.0–5.0)
Cholesterol, Total: 109 mg/dL (ref 100–199)
HDL: 32 mg/dL — AB (ref >39)
LDL Calculated: 58 mg/dL (ref 0–99)
Triglycerides: 96 mg/dL (ref 0–149)
VLDL CHOLESTEROL CAL: 19 mg/dL (ref 5–40)

## 2015-11-11 ENCOUNTER — Telehealth: Payer: Self-pay | Admitting: Family Medicine

## 2015-11-11 NOTE — Telephone Encounter (Signed)
Pt aware of labs  

## 2015-12-31 ENCOUNTER — Ambulatory Visit (INDEPENDENT_AMBULATORY_CARE_PROVIDER_SITE_OTHER): Payer: BLUE CROSS/BLUE SHIELD | Admitting: Pediatrics

## 2015-12-31 VITALS — BP 121/88 | HR 71 | Temp 97.3°F | Ht 73.0 in | Wt 210.6 lb

## 2015-12-31 DIAGNOSIS — R062 Wheezing: Secondary | ICD-10-CM

## 2015-12-31 DIAGNOSIS — R6889 Other general symptoms and signs: Secondary | ICD-10-CM | POA: Diagnosis not present

## 2015-12-31 DIAGNOSIS — J019 Acute sinusitis, unspecified: Secondary | ICD-10-CM

## 2015-12-31 DIAGNOSIS — Z720 Tobacco use: Secondary | ICD-10-CM

## 2015-12-31 LAB — POCT INFLUENZA A/B
Influenza A, POC: NEGATIVE
Influenza B, POC: NEGATIVE

## 2015-12-31 MED ORDER — DOXYCYCLINE HYCLATE 100 MG PO TABS: 100.0000 mg | ORAL_TABLET | Freq: Two times a day (BID) | ORAL | Status: DC

## 2015-12-31 MED ORDER — SPACER/AERO CHAMBER MOUTHPIECE MISC: Status: DC

## 2015-12-31 MED ORDER — ALBUTEROL SULFATE HFA 108 (90 BASE) MCG/ACT IN AERS: 2.0000 | INHALATION_SPRAY | Freq: Four times a day (QID) | RESPIRATORY_TRACT | Status: DC | PRN

## 2015-12-31 NOTE — Progress Notes (Signed)
Subjective:    Patient ID: Samuel Herman, male    DOB: 1961-01-15, 55 y.o.   MRN: [id number]  CC: Fever; Cough; Ear Pain; and Nasal Congestion   HPI: Samuel Herman is a 55 y.o. male presenting for Fever; Cough; Ear Pain; and Nasal Congestion  Feels good in the morning, has lots of drainage in the morning Taking robutussin, nyquil, nasal spray Started getting sick one week ago, was around grandaughter who had pink eye Took some extra strength tylenol today Drinking lots of water Current smoker Felt feverish this morning, slightly worse than day before   Depression screen [facility] 2/9 12/31/2015 10/16/2015 04/16/2015 05/17/2014  Decreased Interest 0 0 0 0  Down, Depressed, Hopeless 0 0 0 0  PHQ - 2 Score 0 0 0 0     Relevant past medical, surgical, family and social history reviewed and updated as indicated. Interim medical history since our last visit reviewed. Allergies and medications reviewed and updated.    ROS: Per HPI unless specifically indicated above  History  Smoking status  . Current Every Day Smoker -- 0.50 packs/day  . Types: Cigarettes  Smokeless tobacco  . Not on file    Past Medical History Patient Active Problem List   Diagnosis Date Noted  . Stress 02/03/2012  . Atrial fibrillation with RVR (HCC) 12/16/2011  . Tobacco abuse 12/16/2011  . Chest pain 12/16/2011    Current Outpatient Prescriptions  Medication Sig Dispense Refill  . albuterol (PROVENTIL HFA;VENTOLIN HFA) 108 (90 Base) MCG/ACT inhaler Inhale 2 puffs into the lungs every 6 (six) hours as needed for wheezing or shortness of breath. 1 Inhaler 2  . doxycycline (VIBRA-TABS) 100 MG tablet Take 1 tablet (100 mg total) by mouth 2 (two) times daily. 14 tablet 0  . Spacer/Aero Chamber Mouthpiece MISC Please dispense one spacer for use with inhaler. 1 each 0   No current facility-administered medications for this visit.       Objective:    BP 121/88 mmHg  Pulse 71  Temp(Src) 97.3 F  (36.3 C) (Oral)  Ht  (1.854 m)  Wt 210 lb 9.6 oz (95.528 kg)  BMI 27.79 kg/m2  Wt Readings from Last 3 Encounters:  12/31/15 210 lb 9.6 oz (95.528 kg)  10/16/15 197 lb (89.359 kg)  04/16/15 202 lb (91.627 kg)     Gen: NAD, alert, cooperative with exam, NCAT EYES: EOMI, no scleral injection or icterus ENT:  TMs pearly gray b/l, OP without erythema, some pressure in sinuses when he leans forward, no tenderness over sinuses b/l LYMPH: no cervical LAD CV: NRRR, normal S1/S2, no murmur, distal pulses 2+ b/l Resp: moving air well, wheezing b/l with expiration, prolonged exp phase.  normal WOB Abd: +BS, soft, NTND. no guarding or organomegaly Ext: No edema, warm Neuro: Alert and oriented MSK: normal muscle bulk     Assessment & Plan:    Samuel Herman was seen today for wheezing, acute sinusitis. Flu test was negative. Will treat with albuterol TID, doxycycline. Discussed sinus rinses to help with nasal congestion, pt says he is not likely to try. Minimal dry cough now, no increase in sputum production, mostly bothered by nasal congestion.  Diagnoses and all orders for this visit:  Flu-like symptoms -     POCT Influenza A/B  Acute sinusitis, recurrence not specified, unspecified location -     doxycycline (VIBRA-TABS) 100 MG tablet; Take 1 tablet (100 mg total) by mouth 2 (two) times daily.  Wheezing -  albuterol (PROVENTIL HFA;VENTOLIN HFA) 108 (90 Base) MCG/ACT inhaler; Inhale 2 puffs into the lungs every 6 (six) hours as needed for wheezing or shortness of breath. -     Spacer/Aero Chamber Mouthpiece MISC; Please dispense one spacer for use with inhaler. -     doxycycline (VIBRA-TABS) 100 MG tablet; Take 1 tablet (100 mg total) by mouth 2 (two) times daily.   Follow up plan: Return if symptoms worsen or fail to improve.  Samuel Kras, MD [facility] 12/31/2015, 6:40 PM

## 2015-12-31 NOTE — Patient Instructions (Signed)
Albuterol three times a day with spacer for next 3-4 days  Doxycycline twice a day for 7 days

## 2016-07-08 ENCOUNTER — Ambulatory Visit (INDEPENDENT_AMBULATORY_CARE_PROVIDER_SITE_OTHER): Payer: BLUE CROSS/BLUE SHIELD | Admitting: Family Medicine

## 2016-07-08 ENCOUNTER — Encounter: Payer: Self-pay | Admitting: Family Medicine

## 2016-07-08 VITALS — BP 119/72 | HR 96 | Temp 100.5°F | Ht 73.0 in | Wt 199.0 lb

## 2016-07-08 DIAGNOSIS — R062 Wheezing: Secondary | ICD-10-CM | POA: Diagnosis not present

## 2016-07-08 DIAGNOSIS — J209 Acute bronchitis, unspecified: Secondary | ICD-10-CM

## 2016-07-08 MED ORDER — ALBUTEROL SULFATE HFA 108 (90 BASE) MCG/ACT IN AERS: 2.0000 | INHALATION_SPRAY | Freq: Four times a day (QID) | RESPIRATORY_TRACT | 2 refills | Status: DC | PRN

## 2016-07-08 MED ORDER — PREDNISONE 20 MG PO TABS: ORAL_TABLET | ORAL | 0 refills | Status: DC

## 2016-07-08 MED ORDER — DOXYCYCLINE HYCLATE 100 MG PO TABS: 100.0000 mg | ORAL_TABLET | Freq: Two times a day (BID) | ORAL | 0 refills | Status: DC

## 2016-07-08 NOTE — Progress Notes (Signed)
BP 119/72 (BP Location: Left Arm, Patient Position: Sitting, Cuff Size: Large)   Pulse 96   Temp (!) 100.5 F (38.1 C) (Oral)   Ht 6\' 1"  (1.854 m)   Wt 199 lb (90.3 kg)   SpO2 97%   BMI 26.25 kg/m    Subjective:    Patient ID: Samuel Herman, male    DOB: 1961/08/25, 55 y.o.   MRN: [id number]  HPI: Samuel Herman is a 55 y.o. male presenting on 07/08/2016 for Cough (began 2 weeks ago, after going on a camping trip where he was out in the rain and riding 4 wheelers); Bronchitis (chest congestion); and Tick bites (had several tick bites while he was out camping, has been aching all over)   HPI Cough and congestion and wheezing Patient has been having cough and congestion and wheezing that has been increasing over the past week. He denies any fevers or chills but has felt flushed afterwards had some coughing and wheezing episodes. He denies any sick contacts that he knows of. He is still smoking about a pack per day and has no intentions of quitting. This is his second episode of this this year.  Relevant past medical, surgical, family and social history reviewed and updated as indicated. Interim medical history since our last visit reviewed. Allergies and medications reviewed and updated.  Review of Systems  Constitutional: Negative for fever.  HENT: Positive for congestion, sinus pressure and sore throat. Negative for ear discharge, ear pain and rhinorrhea.   Eyes: Negative for discharge and visual disturbance.  Respiratory: Positive for cough and wheezing. Negative for shortness of breath.   Cardiovascular: Negative for chest pain and leg swelling.  Gastrointestinal: Negative for abdominal pain, constipation and diarrhea.  Genitourinary: Negative for difficulty urinating.  Musculoskeletal: Negative for back pain and gait problem.  Skin: Negative for rash.  Neurological: Negative for syncope, light-headedness and headaches.  All other systems reviewed and are  negative.   Per HPI unless specifically indicated above     Medication List       Accurate as of 07/08/16  4:39 PM. Always use your most recent med list.          albuterol 108 (90 Base) MCG/ACT inhaler Commonly known as:  PROVENTIL HFA;VENTOLIN HFA Inhale 2 puffs into the lungs every 6 (six) hours as needed for wheezing or shortness of breath.   doxycycline 100 MG tablet Commonly known as:  VIBRA-TABS Take 1 tablet (100 mg total) by mouth 2 (two) times daily.   guaiFENesin-dextromethorphan 100-10 MG/5ML syrup Commonly known as:  ROBITUSSIN DM Take 10 mLs by mouth every 4 (four) hours as needed for cough.   ibuprofen 600 MG tablet Commonly known as:  ADVIL,MOTRIN Take 600 mg by mouth every 6 (six) hours as needed.   predniSONE 20 MG tablet Commonly known as:  DELTASONE 2 po at same time daily for 5 days   Spacer/Aero Chamber Mouthpiece Misc Please dispense one spacer for use with inhaler.          Objective:    BP 119/72 (BP Location: Left Arm, Patient Position: Sitting, Cuff Size: Large)   Pulse 96   Temp (!) 100.5 F (38.1 C) (Oral)   Ht 6\' 1"  (1.854 m)   Wt 199 lb (90.3 kg)   SpO2 97%   BMI 26.25 kg/m   Wt Readings from Last 3 Encounters:  07/08/16 199 lb (90.3 kg)  12/31/15 210 lb 9.6 oz (95.5 kg)  10/16/15 197  lb (89.4 kg)    Physical Exam  Constitutional: He is oriented to person, place, and time. He appears well-developed and well-nourished. No distress.  HENT:  Right Ear: Tympanic membrane, external ear and ear canal normal.  Left Ear: Tympanic membrane, external ear and ear canal normal.  Nose: Mucosal edema and rhinorrhea present. No sinus tenderness. No epistaxis. Right sinus exhibits maxillary sinus tenderness. Right sinus exhibits no frontal sinus tenderness. Left sinus exhibits maxillary sinus tenderness. Left sinus exhibits no frontal sinus tenderness.  Mouth/Throat: Uvula is midline and mucous membranes are normal. Posterior oropharyngeal  edema and posterior oropharyngeal erythema present. No oropharyngeal exudate or tonsillar abscesses.  Eyes: Conjunctivae and EOM are normal. Pupils are equal, round, and reactive to light. Right eye exhibits no discharge. No scleral icterus.  Neck: Neck supple. No thyromegaly present.  Cardiovascular: Normal rate, regular rhythm, normal heart sounds and intact distal pulses.   No murmur heard. Pulmonary/Chest: Effort normal. No respiratory distress. He has wheezes. He has no rales. He exhibits no tenderness.  Musculoskeletal: Normal range of motion. He exhibits no edema.  Lymphadenopathy:    He has no cervical adenopathy.  Neurological: He is alert and oriented to person, place, and time. Coordination normal.  Skin: Skin is warm and dry. No rash noted. He is not diaphoretic.  Psychiatric: He has a normal mood and affect. His behavior is normal.  Nursing note and vitals reviewed.     Assessment & Plan:   Problem List Items Addressed This Visit      Other   Wheezing - Primary   Relevant Medications   doxycycline (VIBRA-TABS) 100 MG tablet   albuterol (PROVENTIL HFA;VENTOLIN HFA) 108 (90 Base) MCG/ACT inhaler   predniSONE (DELTASONE) 20 MG tablet    Other Visit Diagnoses    Acute bronchitis, unspecified organism       Relevant Medications   doxycycline (VIBRA-TABS) 100 MG tablet   albuterol (PROVENTIL HFA;VENTOLIN HFA) 108 (90 Base) MCG/ACT inhaler   predniSONE (DELTASONE) 20 MG tablet       Follow up plan: Return if symptoms worsen or fail to improve.  Counseling provided for all of the vaccine components No orders of the defined types were placed in this encounter.   Arville Care, MD [facility] 07/08/2016, 4:39 PM

## 2016-11-02 ENCOUNTER — Encounter: Payer: BLUE CROSS/BLUE SHIELD | Admitting: Family Medicine

## 2016-11-11 ENCOUNTER — Encounter: Payer: BLUE CROSS/BLUE SHIELD | Admitting: Physician Assistant

## 2016-11-16 ENCOUNTER — Encounter: Payer: Self-pay | Admitting: Family Medicine

## 2016-11-16 ENCOUNTER — Ambulatory Visit (INDEPENDENT_AMBULATORY_CARE_PROVIDER_SITE_OTHER): Payer: BLUE CROSS/BLUE SHIELD | Admitting: Family Medicine

## 2016-11-16 VITALS — BP 123/78 | HR 74 | Temp 98.4°F | Ht 73.0 in | Wt 206.0 lb

## 2016-11-16 DIAGNOSIS — Z0000 Encounter for general adult medical examination without abnormal findings: Secondary | ICD-10-CM | POA: Diagnosis not present

## 2016-11-16 DIAGNOSIS — Z716 Tobacco abuse counseling: Secondary | ICD-10-CM

## 2016-11-16 MED ORDER — VARENICLINE TARTRATE 1 MG PO TABS: 1.0000 mg | ORAL_TABLET | Freq: Two times a day (BID) | ORAL | 0 refills | Status: DC

## 2016-11-16 MED ORDER — VARENICLINE TARTRATE 0.5 MG X 11 & 1 MG X 42 PO MISC: ORAL | 0 refills | Status: DC

## 2016-11-16 NOTE — Progress Notes (Signed)
BP 123/78   Pulse 74   Temp 98.4 F (36.9 C) (Oral)   Ht 6' 1"  (1.854 m)   Wt 206 lb (93.4 kg)   BMI 27.18 kg/m    Subjective:    Patient ID: Samuel Herman, male    DOB: Jul 09, 1961, 55 y.o.   MRN: [id number]  HPI: Samuel Herman is a 55 y.o. male presenting on 11/16/2016 for Annual Exam   HPI Well adult exam Patient is coming in for well adult exam today. He is also coming in for routine fasting labs. He denies any major health issues currently. He has not experienced any more episodes of palpitations. He denies any chest pain, shortness of breath, headaches or vision issues, abdominal complaints, diarrhea, nausea, vomiting, or joint issues. He is still smoking but would like to try something to try to quit smoking. He has tried Chantix once before would like to try again.  Relevant past medical, surgical, family and social history reviewed and updated as indicated. Interim medical history since our last visit reviewed. Allergies and medications reviewed and updated.  Review of Systems  Constitutional: Negative for chills and fever.  HENT: Negative for ear pain and tinnitus.   Eyes: Negative for pain and discharge.  Respiratory: Negative for cough, shortness of breath and wheezing.   Cardiovascular: Negative for chest pain, palpitations and leg swelling.  Gastrointestinal: Negative for abdominal pain, blood in stool, constipation and diarrhea.  Genitourinary: Negative for dysuria and hematuria.  Musculoskeletal: Negative for back pain, gait problem and myalgias.  Skin: Negative for rash.  Neurological: Negative for dizziness, weakness and headaches.  Psychiatric/Behavioral: Negative for suicidal ideas.  All other systems reviewed and are negative.   Per HPI unless specifically indicated above     Medication List       Accurate as of 11/16/16  5:26 PM. Always use your most recent med list.          albuterol 108 (90 Base) MCG/ACT inhaler Commonly known as:   PROVENTIL HFA;VENTOLIN HFA Inhale 2 puffs into the lungs every 6 (six) hours as needed for wheezing or shortness of breath.   ibuprofen 600 MG tablet Commonly known as:  ADVIL,MOTRIN Take 600 mg by mouth every 6 (six) hours as needed.   Spacer/Aero Chamber Mouthpiece Misc Please dispense one spacer for use with inhaler.   varenicline 0.5 MG X 11 & 1 MG X 42 tablet Commonly known as:  CHANTIX STARTING MONTH PAK Take one 0.5 mg tablet by mouth daily for 3 days, then one 0.5 mg tablet twice daily for 4 days, then one 1 mg tablet twice daily.   varenicline 1 MG tablet Commonly known as:  CHANTIX CONTINUING MONTH PAK Take 1 tablet (1 mg total) by mouth 2 (two) times daily.          Objective:    BP 123/78   Pulse 74   Temp 98.4 F (36.9 C) (Oral)   Ht 6' 1"  (1.854 m)   Wt 206 lb (93.4 kg)   BMI 27.18 kg/m   Wt Readings from Last 3 Encounters:  11/16/16 206 lb (93.4 kg)  07/08/16 199 lb (90.3 kg)  12/31/15 210 lb 9.6 oz (95.5 kg)    Physical Exam  Constitutional: He is oriented to person, place, and time. He appears well-developed and well-nourished. No distress.  HENT:  Right Ear: External ear normal.  Left Ear: External ear normal.  Nose: Nose normal.  Mouth/Throat: Oropharynx is clear and moist. No  oropharyngeal exudate.  Eyes: Conjunctivae and EOM are normal. Pupils are equal, round, and reactive to light. Right eye exhibits no discharge. No scleral icterus.  Neck: Neck supple. No thyromegaly present.  Cardiovascular: Normal rate, regular rhythm, normal heart sounds and intact distal pulses.   No murmur heard. Pulmonary/Chest: Effort normal and breath sounds normal. No respiratory distress. He has no wheezes.  Abdominal: Soft. Bowel sounds are normal. He exhibits no distension. There is no tenderness. There is no rebound and no guarding.  Musculoskeletal: Normal range of motion. He exhibits no edema.  Lymphadenopathy:    He has no cervical adenopathy.  Neurological:  He is alert and oriented to person, place, and time. Coordination normal.  Skin: Skin is warm and dry. No rash noted. He is not diaphoretic.  Psychiatric: He has a normal mood and affect. His behavior is normal.  Vitals reviewed.     Assessment & Plan:   Problem List Items Addressed This Visit    None    Visit Diagnoses    Well adult exam    -  Primary   Relevant Orders   CMP14+EGFR   Lipid panel   PSA, total and free   Encounter for smoking cessation counseling       Relevant Medications   varenicline (CHANTIX STARTING MONTH PAK) 0.5 MG X 11 & 1 MG X 42 tablet   varenicline (CHANTIX CONTINUING MONTH PAK) 1 MG tablet      Follow up plan: Return in about 1 year (around 11/16/2017), or if symptoms worsen or fail to improve.  Counseling provided for all of the vaccine components Orders Placed This Encounter  Procedures  . CMP14+EGFR  . Lipid panel  . PSA, total and free    Caryl Pina, MD [facility] 11/16/2016, 5:26 PM

## 2016-11-28 ENCOUNTER — Other Ambulatory Visit: Payer: BLUE CROSS/BLUE SHIELD

## 2016-11-28 DIAGNOSIS — Z0000 Encounter for general adult medical examination without abnormal findings: Secondary | ICD-10-CM

## 2016-11-29 LAB — CMP14+EGFR
ALBUMIN: 4.4 g/dL (ref 3.5–5.5)
ALK PHOS: 57 IU/L (ref 39–117)
ALT: 20 IU/L (ref 0–44)
AST: 19 IU/L (ref 0–40)
Albumin/Globulin Ratio: 1.7 (ref 1.2–2.2)
BILIRUBIN TOTAL: 0.7 mg/dL (ref 0.0–1.2)
BUN / CREAT RATIO: 13 (ref 9–20)
BUN: 13 mg/dL (ref 6–24)
CHLORIDE: 104 mmol/L (ref 96–106)
CO2: 22 mmol/L (ref 18–29)
Calcium: 9.7 mg/dL (ref 8.7–10.2)
Creatinine, Ser: 0.98 mg/dL (ref 0.76–1.27)
GFR calc Af Amer: 99 mL/min/{1.73_m2} (ref >59)
GFR calc non Af Amer: 86 mL/min/{1.73_m2} (ref >59)
GLOBULIN, TOTAL: 2.6 g/dL (ref 1.5–4.5)
GLUCOSE: 98 mg/dL (ref 65–99)
Potassium: 4.6 mmol/L (ref 3.5–5.2)
SODIUM: 143 mmol/L (ref 134–144)
Total Protein: 7 g/dL (ref 6.0–8.5)

## 2016-11-29 LAB — LIPID PANEL
CHOLESTEROL TOTAL: 111 mg/dL (ref 100–199)
Chol/HDL Ratio: 3.1 ratio units (ref 0.0–5.0)
HDL: 36 mg/dL — ABNORMAL LOW (ref >39)
LDL Calculated: 57 mg/dL (ref 0–99)
Triglycerides: 92 mg/dL (ref 0–149)
VLDL Cholesterol Cal: 18 mg/dL (ref 5–40)

## 2016-11-29 LAB — PSA, TOTAL AND FREE
PSA FREE PCT: 51.3 %
PSA FREE: 0.41 ng/mL
Prostate Specific Ag, Serum: 0.8 ng/mL (ref 0.0–4.0)

## 2016-12-03 ENCOUNTER — Encounter: Payer: Self-pay | Admitting: *Deleted

## 2017-11-18 ENCOUNTER — Encounter: Payer: Self-pay | Admitting: Family Medicine

## 2017-11-18 ENCOUNTER — Ambulatory Visit (INDEPENDENT_AMBULATORY_CARE_PROVIDER_SITE_OTHER): Payer: BLUE CROSS/BLUE SHIELD | Admitting: Family Medicine

## 2017-11-18 VITALS — BP 124/78 | HR 76 | Temp 97.1°F | Ht 73.0 in | Wt 208.0 lb

## 2017-11-18 DIAGNOSIS — Z13220 Encounter for screening for lipoid disorders: Secondary | ICD-10-CM

## 2017-11-18 DIAGNOSIS — Z0000 Encounter for general adult medical examination without abnormal findings: Secondary | ICD-10-CM

## 2017-11-18 DIAGNOSIS — Z125 Encounter for screening for malignant neoplasm of prostate: Secondary | ICD-10-CM

## 2017-11-18 DIAGNOSIS — Z114 Encounter for screening for human immunodeficiency virus [HIV]: Secondary | ICD-10-CM

## 2017-11-18 DIAGNOSIS — Z131 Encounter for screening for diabetes mellitus: Secondary | ICD-10-CM

## 2017-11-18 DIAGNOSIS — Z1159 Encounter for screening for other viral diseases: Secondary | ICD-10-CM

## 2017-11-18 NOTE — Progress Notes (Signed)
 BP 124/78   Pulse 76   Temp (!) 97.1 F (36.2 C) (Oral)   Ht 6' 1" (1.854 m)   Wt 208 lb (94.3 kg)   BMI 27.44 kg/m    Subjective:    Patient ID: Samuel Herman, male    DOB: 03/06/1961, 56 y.o.   MRN: [id number]  HPI: Samuel Herman is a 56 y.o. male presenting on 11/18/2017 for Annual Exam   HPI Adult well exam and physical Patient is coming in today for adult well exam and physical and yearly labs.  He denies any major health issues except for the occasional sciatica which she has dealt with.  He denies any breathing issues or urinary issues.  He is still trying to work on quitting smoking and his wife recently got him some Chantix to try but he has not try to get. Patient denies any chest pain, shortness of breath, headaches or vision issues, abdominal complaints, diarrhea, nausea, vomiting, or joint issues.   Relevant past medical, surgical, family and social history reviewed and updated as indicated. Interim medical history since our last visit reviewed. Allergies and medications reviewed and updated.  Review of Systems  Constitutional: Negative for chills and fever.  HENT: Negative for ear pain and tinnitus.   Eyes: Negative for pain and discharge.  Respiratory: Negative for cough, shortness of breath and wheezing.   Cardiovascular: Negative for chest pain, palpitations and leg swelling.  Gastrointestinal: Negative for abdominal pain, blood in stool, constipation and diarrhea.  Genitourinary: Negative for dysuria and hematuria.  Musculoskeletal: Negative for back pain, gait problem and myalgias.  Skin: Negative for rash.  Neurological: Negative for dizziness, weakness and headaches.  Psychiatric/Behavioral: Negative for suicidal ideas.  All other systems reviewed and are negative.   Per HPI unless specifically indicated above        Objective:    BP 124/78   Pulse 76   Temp (!) 97.1 F (36.2 C) (Oral)   Ht 6' 1" (1.854 m)   Wt 208 lb (94.3 kg)   BMI  27.44 kg/m   Wt Readings from Last 3 Encounters:  11/18/17 208 lb (94.3 kg)  11/16/16 206 lb (93.4 kg)  07/08/16 199 lb (90.3 kg)    Physical Exam  Constitutional: He is oriented to person, place, and time. He appears well-developed and well-nourished. No distress.  HENT:  Right Ear: External ear normal.  Left Ear: External ear normal.  Nose: Nose normal.  Mouth/Throat: Oropharynx is clear and moist. No oropharyngeal exudate.  Eyes: Conjunctivae and EOM are normal. Pupils are equal, round, and reactive to light. Right eye exhibits no discharge. No scleral icterus.  Neck: Neck supple. No thyromegaly present.  Cardiovascular: Normal rate, regular rhythm, normal heart sounds and intact distal pulses.  No murmur heard. Pulmonary/Chest: Effort normal and breath sounds normal. No respiratory distress. He has no wheezes.  Abdominal: Soft. Bowel sounds are normal. He exhibits no distension. There is no tenderness. There is no rebound and no guarding.  Musculoskeletal: Normal range of motion. He exhibits no edema.  Lymphadenopathy:    He has no cervical adenopathy.  Neurological: He is alert and oriented to person, place, and time. Coordination normal.  Skin: Skin is warm and dry. No rash noted. He is not diaphoretic.  Psychiatric: He has a normal mood and affect. His behavior is normal.  Nursing note and vitals reviewed.       Assessment & Plan:   Problem List Items Addressed This Visit      None    Visit Diagnoses    Well adult exam    -  Primary   Relevant Orders   CMP14+EGFR   Lipid panel   HIV antibody   Hepatitis C antibody   PSA, total and free   Prostate cancer screening       Relevant Orders   PSA, total and free   Lipid screening       Relevant Orders   Lipid panel   Screening for HIV without presence of risk factors       Relevant Orders   HIV antibody   Need for hepatitis C screening test       Relevant Orders   Hepatitis C antibody   Screening for diabetes  mellitus       Relevant Orders   CMP14+EGFR       Follow up plan: Return in about 1 year (around 11/18/2018), or if symptoms worsen or fail to improve.  Counseling provided for all of the vaccine components Orders Placed This Encounter  Procedures  . CMP14+EGFR  . Lipid panel  . HIV antibody  . Hepatitis C antibody  . PSA, total and free     , MD [facility] 11/18/2017, 2:43 PM     

## 2017-11-19 LAB — HIV ANTIBODY (ROUTINE TESTING W REFLEX): HIV SCREEN 4TH GENERATION: NONREACTIVE

## 2017-11-19 LAB — LIPID PANEL
CHOL/HDL RATIO: 3.7 ratio (ref 0.0–5.0)
Cholesterol, Total: 107 mg/dL (ref 100–199)
HDL: 29 mg/dL — AB (ref >39)
LDL CALC: 44 mg/dL (ref 0–99)
Triglycerides: 172 mg/dL — ABNORMAL HIGH (ref 0–149)
VLDL CHOLESTEROL CAL: 34 mg/dL (ref 5–40)

## 2017-11-19 LAB — CMP14+EGFR
ALBUMIN: 4.5 g/dL (ref 3.5–5.5)
ALT: 16 IU/L (ref 0–44)
AST: 21 IU/L (ref 0–40)
Albumin/Globulin Ratio: 2.1 (ref 1.2–2.2)
Alkaline Phosphatase: 53 IU/L (ref 39–117)
BILIRUBIN TOTAL: 0.8 mg/dL (ref 0.0–1.2)
BUN / CREAT RATIO: 12 (ref 9–20)
BUN: 15 mg/dL (ref 6–24)
CALCIUM: 9.3 mg/dL (ref 8.7–10.2)
CHLORIDE: 103 mmol/L (ref 96–106)
CO2: 22 mmol/L (ref 20–29)
CREATININE: 1.3 mg/dL — AB (ref 0.76–1.27)
GFR, EST AFRICAN AMERICAN: 70 mL/min/{1.73_m2} (ref >59)
GFR, EST NON AFRICAN AMERICAN: 61 mL/min/{1.73_m2} (ref >59)
GLUCOSE: 79 mg/dL (ref 65–99)
Globulin, Total: 2.1 g/dL (ref 1.5–4.5)
Potassium: 4.5 mmol/L (ref 3.5–5.2)
Sodium: 142 mmol/L (ref 134–144)
TOTAL PROTEIN: 6.6 g/dL (ref 6.0–8.5)

## 2017-11-19 LAB — PSA, TOTAL AND FREE
PSA, Free Pct: 55.7 %
PSA, Free: 0.39 ng/mL
Prostate Specific Ag, Serum: 0.7 ng/mL (ref 0.0–4.0)

## 2017-11-19 LAB — HEPATITIS C ANTIBODY

## 2017-11-24 ENCOUNTER — Other Ambulatory Visit: Payer: Self-pay | Admitting: *Deleted

## 2017-11-24 DIAGNOSIS — R7989 Other specified abnormal findings of blood chemistry: Secondary | ICD-10-CM

## 2017-12-15 DIAGNOSIS — M9904 Segmental and somatic dysfunction of sacral region: Secondary | ICD-10-CM | POA: Diagnosis not present

## 2017-12-15 DIAGNOSIS — M9903 Segmental and somatic dysfunction of lumbar region: Secondary | ICD-10-CM | POA: Diagnosis not present

## 2017-12-15 DIAGNOSIS — M9902 Segmental and somatic dysfunction of thoracic region: Secondary | ICD-10-CM | POA: Diagnosis not present

## 2017-12-15 DIAGNOSIS — M5136 Other intervertebral disc degeneration, lumbar region: Secondary | ICD-10-CM | POA: Diagnosis not present

## 2017-12-20 DIAGNOSIS — M9904 Segmental and somatic dysfunction of sacral region: Secondary | ICD-10-CM | POA: Diagnosis not present

## 2017-12-20 DIAGNOSIS — M9902 Segmental and somatic dysfunction of thoracic region: Secondary | ICD-10-CM | POA: Diagnosis not present

## 2017-12-20 DIAGNOSIS — M5136 Other intervertebral disc degeneration, lumbar region: Secondary | ICD-10-CM | POA: Diagnosis not present

## 2017-12-20 DIAGNOSIS — M9903 Segmental and somatic dysfunction of lumbar region: Secondary | ICD-10-CM | POA: Diagnosis not present

## 2017-12-23 DIAGNOSIS — M9903 Segmental and somatic dysfunction of lumbar region: Secondary | ICD-10-CM | POA: Diagnosis not present

## 2017-12-23 DIAGNOSIS — M9902 Segmental and somatic dysfunction of thoracic region: Secondary | ICD-10-CM | POA: Diagnosis not present

## 2017-12-23 DIAGNOSIS — M5136 Other intervertebral disc degeneration, lumbar region: Secondary | ICD-10-CM | POA: Diagnosis not present

## 2017-12-23 DIAGNOSIS — M9904 Segmental and somatic dysfunction of sacral region: Secondary | ICD-10-CM | POA: Diagnosis not present

## 2017-12-27 DIAGNOSIS — M9903 Segmental and somatic dysfunction of lumbar region: Secondary | ICD-10-CM | POA: Diagnosis not present

## 2017-12-27 DIAGNOSIS — M9902 Segmental and somatic dysfunction of thoracic region: Secondary | ICD-10-CM | POA: Diagnosis not present

## 2017-12-27 DIAGNOSIS — M9904 Segmental and somatic dysfunction of sacral region: Secondary | ICD-10-CM | POA: Diagnosis not present

## 2017-12-27 DIAGNOSIS — M5136 Other intervertebral disc degeneration, lumbar region: Secondary | ICD-10-CM | POA: Diagnosis not present

## 2017-12-28 DIAGNOSIS — M5033 Other cervical disc degeneration, cervicothoracic region: Secondary | ICD-10-CM | POA: Diagnosis not present

## 2017-12-28 DIAGNOSIS — M5030 Other cervical disc degeneration, unspecified cervical region: Secondary | ICD-10-CM | POA: Insufficient documentation

## 2017-12-30 DIAGNOSIS — M9902 Segmental and somatic dysfunction of thoracic region: Secondary | ICD-10-CM | POA: Diagnosis not present

## 2017-12-30 DIAGNOSIS — M9903 Segmental and somatic dysfunction of lumbar region: Secondary | ICD-10-CM | POA: Diagnosis not present

## 2017-12-30 DIAGNOSIS — M9904 Segmental and somatic dysfunction of sacral region: Secondary | ICD-10-CM | POA: Diagnosis not present

## 2017-12-30 DIAGNOSIS — M5136 Other intervertebral disc degeneration, lumbar region: Secondary | ICD-10-CM | POA: Diagnosis not present

## 2018-01-06 DIAGNOSIS — M5136 Other intervertebral disc degeneration, lumbar region: Secondary | ICD-10-CM | POA: Diagnosis not present

## 2018-01-06 DIAGNOSIS — M9902 Segmental and somatic dysfunction of thoracic region: Secondary | ICD-10-CM | POA: Diagnosis not present

## 2018-01-06 DIAGNOSIS — M9903 Segmental and somatic dysfunction of lumbar region: Secondary | ICD-10-CM | POA: Diagnosis not present

## 2018-01-06 DIAGNOSIS — M9904 Segmental and somatic dysfunction of sacral region: Secondary | ICD-10-CM | POA: Diagnosis not present

## 2018-01-11 DIAGNOSIS — M5030 Other cervical disc degeneration, unspecified cervical region: Secondary | ICD-10-CM | POA: Diagnosis not present

## 2018-01-20 DIAGNOSIS — M9903 Segmental and somatic dysfunction of lumbar region: Secondary | ICD-10-CM | POA: Diagnosis not present

## 2018-01-20 DIAGNOSIS — M9904 Segmental and somatic dysfunction of sacral region: Secondary | ICD-10-CM | POA: Diagnosis not present

## 2018-01-20 DIAGNOSIS — M5136 Other intervertebral disc degeneration, lumbar region: Secondary | ICD-10-CM | POA: Diagnosis not present

## 2018-01-20 DIAGNOSIS — M9902 Segmental and somatic dysfunction of thoracic region: Secondary | ICD-10-CM | POA: Diagnosis not present

## 2018-02-10 DIAGNOSIS — M9903 Segmental and somatic dysfunction of lumbar region: Secondary | ICD-10-CM | POA: Diagnosis not present

## 2018-02-10 DIAGNOSIS — M9902 Segmental and somatic dysfunction of thoracic region: Secondary | ICD-10-CM | POA: Diagnosis not present

## 2018-02-10 DIAGNOSIS — M9904 Segmental and somatic dysfunction of sacral region: Secondary | ICD-10-CM | POA: Diagnosis not present

## 2018-02-10 DIAGNOSIS — M5136 Other intervertebral disc degeneration, lumbar region: Secondary | ICD-10-CM | POA: Diagnosis not present

## 2018-02-22 DIAGNOSIS — M5412 Radiculopathy, cervical region: Secondary | ICD-10-CM | POA: Diagnosis not present

## 2018-03-03 DIAGNOSIS — M9904 Segmental and somatic dysfunction of sacral region: Secondary | ICD-10-CM | POA: Diagnosis not present

## 2018-03-03 DIAGNOSIS — M9902 Segmental and somatic dysfunction of thoracic region: Secondary | ICD-10-CM | POA: Diagnosis not present

## 2018-03-03 DIAGNOSIS — M5136 Other intervertebral disc degeneration, lumbar region: Secondary | ICD-10-CM | POA: Diagnosis not present

## 2018-03-03 DIAGNOSIS — M9903 Segmental and somatic dysfunction of lumbar region: Secondary | ICD-10-CM | POA: Diagnosis not present

## 2018-03-10 DIAGNOSIS — M5033 Other cervical disc degeneration, cervicothoracic region: Secondary | ICD-10-CM | POA: Diagnosis not present

## 2018-11-23 ENCOUNTER — Encounter: Payer: Self-pay | Admitting: Family Medicine

## 2018-11-23 ENCOUNTER — Ambulatory Visit (INDEPENDENT_AMBULATORY_CARE_PROVIDER_SITE_OTHER): Payer: BLUE CROSS/BLUE SHIELD | Admitting: Family Medicine

## 2018-11-23 VITALS — BP 120/79 | HR 71 | Temp 97.7°F | Ht 73.0 in | Wt 209.2 lb

## 2018-11-23 DIAGNOSIS — Z0000 Encounter for general adult medical examination without abnormal findings: Secondary | ICD-10-CM | POA: Diagnosis not present

## 2018-11-23 DIAGNOSIS — Z1211 Encounter for screening for malignant neoplasm of colon: Secondary | ICD-10-CM | POA: Diagnosis not present

## 2018-11-23 DIAGNOSIS — Z23 Encounter for immunization: Secondary | ICD-10-CM

## 2018-11-23 DIAGNOSIS — Z125 Encounter for screening for malignant neoplasm of prostate: Secondary | ICD-10-CM | POA: Diagnosis not present

## 2018-11-23 NOTE — Progress Notes (Signed)
BP 120/79   Pulse 71   Temp 97.7 F (36.5 C) (Oral)   Ht 6' 1"  (1.854 m)   Wt 209 lb 3.2 oz (94.9 kg)   BMI 27.60 kg/m    Subjective:    Patient ID: Samuel Herman, male    DOB: [birth year], 57 y.o.   MRN: [id number]  HPI: Samuel Herman is a 57 y.o. male presenting on 11/23/2018 for Annual Exam   HPI Well adult exam and physical Patient comes in today for a well adult exam and physical and besides the fact that sometimes he gets some next and job muscle spasming on the right side and he wanted to get his ears checked he has not had any further issues with that.  He says it only happens intermittently every now and then maybe every couple weeks he denies any major health issues or health concerns and says otherwise he keeps really healthy.  He is still smoking and has been trying to cut back but is not ready to quit yet. Patient denies any chest pain, shortness of breath, headaches or vision issues, abdominal complaints, diarrhea, nausea, vomiting, or joint issues.   Relevant past medical, surgical, family and social history reviewed and updated as indicated. Interim medical history since our last visit reviewed. Allergies and medications reviewed and updated.  Review of Systems  Constitutional: Negative for chills and fever.  HENT: Negative for ear pain and tinnitus.   Eyes: Negative for pain.  Respiratory: Negative for cough, shortness of breath and wheezing.   Cardiovascular: Negative for chest pain, palpitations and leg swelling.  Gastrointestinal: Negative for abdominal pain, blood in stool, constipation and diarrhea.  Genitourinary: Negative for dysuria and hematuria.  Musculoskeletal: Negative for back pain and myalgias.  Skin: Negative for rash.  Neurological: Negative for dizziness, weakness and headaches.  Psychiatric/Behavioral: Negative for suicidal ideas.    Per HPI unless specifically indicated above   Allergies as of 11/23/2018      Reactions   Other  Other (See Comments)   There was an antibiotic that caused hives about 20 years ago.unsure of the name.      Medication List    as of 11/23/2018  3:11 PM   You have not been prescribed any medications.        Objective:    BP 120/79   Pulse 71   Temp 97.7 F (36.5 C) (Oral)   Ht 6' 1"  (1.854 m)   Wt 209 lb 3.2 oz (94.9 kg)   BMI 27.60 kg/m   Wt Readings from Last 3 Encounters:  11/23/18 209 lb 3.2 oz (94.9 kg)  11/18/17 208 lb (94.3 kg)  11/16/16 206 lb (93.4 kg)    Physical Exam  Constitutional: He is oriented to person, place, and time. He appears well-developed and well-nourished. No distress.  HENT:  Right Ear: External ear normal.  Left Ear: External ear normal.  Nose: Nose normal.  Mouth/Throat: Oropharynx is clear and moist. No oropharyngeal exudate.  Eyes: Pupils are equal, round, and reactive to light. Conjunctivae and EOM are normal. No scleral icterus.  Neck: Neck supple. No thyromegaly present.  Cardiovascular: Normal rate, regular rhythm, normal heart sounds and intact distal pulses.  No murmur heard. Pulmonary/Chest: Effort normal and breath sounds normal. No respiratory distress. He has no wheezes.  Abdominal: Soft. Bowel sounds are normal. He exhibits no distension. There is no tenderness. There is no rebound and no guarding.  Genitourinary:  Genitourinary Comments: Patient declined genital  exam  Musculoskeletal: Normal range of motion. He exhibits no edema.  Lymphadenopathy:    He has no cervical adenopathy.  Neurological: He is alert and oriented to person, place, and time. Coordination normal.  Skin: Skin is warm and dry. No rash noted. He is not diaphoretic.  Psychiatric: He has a normal mood and affect. His behavior is normal.  Nursing note and vitals reviewed.       Assessment & Plan:   Problem List Items Addressed This Visit    None    Visit Diagnoses    Well adult exam    -  Primary   Relevant Orders   CBC with  Differential/Platelet   CMP14+EGFR   Lipid panel   PSA, total and free   Prostate cancer screening       Relevant Orders   PSA, total and free   Colon cancer screening       Relevant Orders   Cologuard       Follow up plan: Return in about 1 year (around 11/24/2019), or if symptoms worsen or fail to improve, for Physical.  Counseling provided for all of the vaccine components Orders Placed This Encounter  Procedures  . Tdap vaccine greater than or equal to 7yo IM  . CBC with Differential/Platelet  . CMP14+EGFR  . Lipid panel  . PSA, total and free  . Cologuard    Caryl Pina, MD [facility] 11/23/2018, 3:11 PM

## 2018-11-24 LAB — CBC WITH DIFFERENTIAL/PLATELET
BASOS ABS: 0 10*3/uL (ref 0.0–0.2)
Basos: 0 %
EOS (ABSOLUTE): 0.1 10*3/uL (ref 0.0–0.4)
Eos: 2 %
Hematocrit: 42.6 % (ref 37.5–51.0)
Hemoglobin: 14.7 g/dL (ref 13.0–17.7)
IMMATURE GRANS (ABS): 0.1 10*3/uL (ref 0.0–0.1)
IMMATURE GRANULOCYTES: 1 %
Lymphocytes Absolute: 1.7 10*3/uL (ref 0.7–3.1)
Lymphs: 18 %
MCH: 32.3 pg (ref 26.6–33.0)
MCHC: 34.5 g/dL (ref 31.5–35.7)
MCV: 94 fL (ref 79–97)
Monocytes Absolute: 0.6 10*3/uL (ref 0.1–0.9)
Monocytes: 7 %
Neutrophils Absolute: 6.6 10*3/uL (ref 1.4–7.0)
Neutrophils: 72 %
Platelets: 218 10*3/uL (ref 150–450)
RBC: 4.55 x10E6/uL (ref 4.14–5.80)
RDW: 12.3 % (ref 12.3–15.4)
WBC: 9.1 10*3/uL (ref 3.4–10.8)

## 2018-11-24 LAB — PSA, TOTAL AND FREE
PROSTATE SPECIFIC AG, SERUM: 0.7 ng/mL (ref 0.0–4.0)
PSA FREE PCT: 52.9 %
PSA FREE: 0.37 ng/mL

## 2018-11-24 LAB — CMP14+EGFR
ALBUMIN: 4.5 g/dL (ref 3.5–5.5)
ALK PHOS: 54 IU/L (ref 39–117)
ALT: 19 IU/L (ref 0–44)
AST: 20 IU/L (ref 0–40)
Albumin/Globulin Ratio: 1.9 (ref 1.2–2.2)
BUN / CREAT RATIO: 17 (ref 9–20)
BUN: 17 mg/dL (ref 6–24)
Bilirubin Total: 0.9 mg/dL (ref 0.0–1.2)
CHLORIDE: 106 mmol/L (ref 96–106)
CO2: 22 mmol/L (ref 20–29)
CREATININE: 1.02 mg/dL (ref 0.76–1.27)
Calcium: 9.4 mg/dL (ref 8.7–10.2)
GFR calc non Af Amer: 81 mL/min/{1.73_m2} (ref >59)
GFR, EST AFRICAN AMERICAN: 93 mL/min/{1.73_m2} (ref >59)
GLUCOSE: 87 mg/dL (ref 65–99)
Globulin, Total: 2.4 g/dL (ref 1.5–4.5)
POTASSIUM: 4.6 mmol/L (ref 3.5–5.2)
Sodium: 143 mmol/L (ref 134–144)
TOTAL PROTEIN: 6.9 g/dL (ref 6.0–8.5)

## 2018-11-24 LAB — LIPID PANEL
Chol/HDL Ratio: 3.5 ratio (ref 0.0–5.0)
Cholesterol, Total: 125 mg/dL (ref 100–199)
HDL: 36 mg/dL — AB (ref >39)
LDL Calculated: 65 mg/dL (ref 0–99)
Triglycerides: 119 mg/dL (ref 0–149)
VLDL Cholesterol Cal: 24 mg/dL (ref 5–40)

## 2019-07-05 DIAGNOSIS — M5412 Radiculopathy, cervical region: Secondary | ICD-10-CM | POA: Diagnosis not present

## 2019-07-27 DIAGNOSIS — M5030 Other cervical disc degeneration, unspecified cervical region: Secondary | ICD-10-CM | POA: Diagnosis not present

## 2019-11-19 ENCOUNTER — Emergency Department (HOSPITAL_COMMUNITY)
Admission: EM | Admit: 2019-11-19 | Discharge: 2019-11-19 | Disposition: A | Discharge status: Home / Self Care | Payer: BC Managed Care – PPO | Attending: Emergency Medicine | Admitting: Emergency Medicine

## 2019-11-19 ENCOUNTER — Emergency Department (HOSPITAL_COMMUNITY): Payer: BC Managed Care – PPO

## 2019-11-19 ENCOUNTER — Other Ambulatory Visit: Payer: Self-pay

## 2019-11-19 ENCOUNTER — Encounter (HOSPITAL_COMMUNITY): Payer: Self-pay | Admitting: *Deleted

## 2019-11-19 DIAGNOSIS — F17210 Nicotine dependence, cigarettes, uncomplicated: Secondary | ICD-10-CM | POA: Insufficient documentation

## 2019-11-19 DIAGNOSIS — R079 Chest pain, unspecified: Secondary | ICD-10-CM | POA: Diagnosis not present

## 2019-11-19 DIAGNOSIS — I4891 Unspecified atrial fibrillation: Secondary | ICD-10-CM | POA: Diagnosis not present

## 2019-11-19 LAB — CBC
HCT: 43.7 % (ref 39.0–52.0)
Hemoglobin: 14.9 g/dL (ref 13.0–17.0)
MCH: 32.3 pg (ref 26.0–34.0)
MCHC: 34.1 g/dL (ref 30.0–36.0)
MCV: 94.6 fL (ref 80.0–100.0)
Platelets: 207 10*3/uL (ref 150–400)
RBC: 4.62 MIL/uL (ref 4.22–5.81)
RDW: 12.4 % (ref 11.5–15.5)
WBC: 8.8 10*3/uL (ref 4.0–10.5)
nRBC: 0 % (ref 0.0–0.2)

## 2019-11-19 LAB — BASIC METABOLIC PANEL
Anion gap: 9 (ref 5–15)
BUN: 11 mg/dL (ref 6–20)
CO2: 20 mmol/L — ABNORMAL LOW (ref 22–32)
Calcium: 9.3 mg/dL (ref 8.9–10.3)
Chloride: 110 mmol/L (ref 98–111)
Creatinine, Ser: 1.09 mg/dL (ref 0.61–1.24)
GFR calc Af Amer: >60 mL/min (ref >60)
GFR calc non Af Amer: >60 mL/min (ref >60)
Glucose, Bld: 93 mg/dL (ref 70–99)
Potassium: 4 mmol/L (ref 3.5–5.1)
Sodium: 139 mmol/L (ref 135–145)

## 2019-11-19 LAB — TROPONIN I (HIGH SENSITIVITY): Troponin I (High Sensitivity): 6 ng/L (ref <18)

## 2019-11-19 MED ORDER — ETOMIDATE 2 MG/ML IV SOLN
INTRAVENOUS | Status: AC | PRN
  Administered 2019-11-19: 10 mg via INTRAVENOUS

## 2019-11-19 MED ORDER — DILTIAZEM HCL 25 MG/5ML IV SOLN
20.0000 mg | Freq: Once | INTRAVENOUS | Status: AC
  Administered 2019-11-19: 20 mg via INTRAVENOUS
  Filled 2019-11-19: qty 5

## 2019-11-19 MED ORDER — ETOMIDATE 2 MG/ML IV SOLN
10.0000 mg | Freq: Once | INTRAVENOUS | Status: DC
  Filled 2019-11-19: qty 10

## 2019-11-19 MED ORDER — APIXABAN 5 MG PO TABS: 5.0000 mg | ORAL_TABLET | Freq: Two times a day (BID) | ORAL | 0 refills | Status: DC

## 2019-11-19 MED ORDER — APIXABAN 5 MG PO TABS
5.0000 mg | ORAL_TABLET | Freq: Once | ORAL | Status: AC
  Administered 2019-11-19: 5 mg via ORAL
  Filled 2019-11-19: qty 1

## 2019-11-19 MED ORDER — SODIUM CHLORIDE 0.9 % IV BOLUS
1000.0000 mL | Freq: Once | INTRAVENOUS | Status: AC
  Administered 2019-11-19: 1000 mL via INTRAVENOUS

## 2019-11-19 NOTE — ED Provider Notes (Signed)
[facility] Emergency Department Provider Note MRN:  [id number]  Arrival date & time: 11/19/19     Chief Complaint   Chest Pain   History of Present Illness   Samuel Herman is a 58 y.o. year-old male with a history of A. fib presenting to the ED with chief complaint of chest pain.  Patient felt well yesterday, went to bed.  While in bed he felt a funny feeling in his chest.  Described as a very mild discomfort.  Continued today.  Noted that his heart was racing.  Denies shortness of breath.  No cough, no fever, no abdominal pain, no numbness or weakness to the arms or legs.  Review of Systems  A complete 10 system review of systems was obtained and all systems are negative except as noted in the HPI and PMH.   Patient's Health History    Past Medical History:  Diagnosis Date   Atrial fibrillation (HCC)    DCCV 01/2009, not on ASA or antiarrythmic   Tobacco abuse     Past Surgical History:  Procedure Laterality Date   CARDIOVERSION     HERNIA REPAIR     VASECTOMY      Family History  Problem Relation Age of Onset   Heart attack Father 56       deceased   Cancer Mother        breast    Social History   Socioeconomic History   Marital status: Single    Spouse name: Not on file   Number of children: Not on file   Years of education: Not on file   Highest education level: Not on file  Occupational History   Occupation: Insurance risk surveyor  Social Needs   Financial resource strain: Not on file   Food insecurity    Worry: Not on file    Inability: Not on file   Transportation needs    Medical: Not on file    Non-medical: Not on file  Tobacco Use   Smoking status: Current Every Day Smoker    Packs/day: 0.50    Types: Cigarettes   Smokeless tobacco: Never Used  Substance and Sexual Activity   Alcohol use: Yes    Alcohol/week: 2.0 standard drinks    Types: 2 Cans of beer per week   Drug use: No   Sexual activity: Yes   Comment: married 34 years  Lifestyle   Physical activity    Days per week: Not on file    Minutes per session: Not on file   Stress: Not on file  Relationships   Social connections    Talks on phone: Not on file    Gets together: Not on file    Attends religious service: Not on file    Active member of club or organization: Not on file    Attends meetings of clubs or organizations: Not on file    Relationship status: Not on file   Intimate partner violence    Fear of current or ex partner: Not on file    Emotionally abused: Not on file    Physically abused: Not on file    Forced sexual activity: Not on file  Other Topics Concern   Not on file  Social History Narrative   Not on file     Physical Exam  Vital Signs and Nursing Notes reviewed Vitals:   11/19/19 1925 11/19/19 1930  BP: 111/70 111/70  Pulse: 70 79  Resp:  (!) 22  Temp:    SpO2: 100% 99%    CONSTITUTIONAL: Well-appearing, NAD NEURO:  Alert and oriented x 3, no focal deficits EYES:  eyes equal and reactive ENT/NECK:  no LAD, no JVD CARDIO: Tachycardic rate, well-perfused, normal S1 and S2 PULM:  CTAB no wheezing or rhonchi GI/GU:  normal bowel sounds, non-distended, non-tender MSK/SPINE:  No gross deformities, no edema SKIN:  no rash, atraumatic PSYCH:  Appropriate speech and behavior  Diagnostic and Interventional Summary    EKG Interpretation  Date/Time:  Sunday November 19 2019 17:17:11 EST Ventricular Rate:  137 PR Interval:    QRS Duration: 84 QT Interval:  288 QTC Calculation: 434 R Axis:   10 Text Interpretation: Atrial fibrillation with rapid ventricular response Septal infarct , age undetermined Abnormal ECG Confirmed by Gerlene Fee [phone]) on 11/19/2019 6:12:32 PM       EKG Interpretation  Date/Time:  Sunday November 19 2019 19:16:07 EST Ventricular Rate:  79 PR Interval:    QRS Duration: 95 QT Interval:  367 QTC Calculation: 421 R Axis:   21 Text Interpretation: Sinus  rhythm Confirmed by Gerlene Fee [phone]) on 11/19/2019 7:28:31 PM       Labs Reviewed  BASIC METABOLIC PANEL - Abnormal; Notable for the following components:      Result Value   CO2 20 (*)    All other components within normal limits  CBC  TROPONIN I (HIGH SENSITIVITY)    DG Chest 2 View  Final Result      Medications  etomidate (AMIDATE) injection 10 mg (10 mg Intravenous See Procedure Record 11/19/19 1903)  apixaban (ELIQUIS) tablet 5 mg (has no administration in time range)  sodium chloride 0.9 % bolus 1,000 mL (1,000 mLs Intravenous New Bag/Given 11/19/19 1833)  diltiazem (CARDIZEM) injection 20 mg (20 mg Intravenous Given 11/19/19 1834)  etomidate (AMIDATE) injection (10 mg Intravenous Given 11/19/19 1903)     Procedures  /  Critical Care .Critical Care Performed by: Maudie Flakes, MD Authorized by: Maudie Flakes, MD   Critical care provider statement:    Critical care time (minutes):  34   Critical care was necessary to treat or prevent imminent or life-threatening deterioration of the following conditions: Atrial fibrillation with rapid ventricular response.   Critical care was time spent personally by me on the following activities:  Discussions with consultants, evaluation of patient's response to treatment, examination of patient, ordering and performing treatments and interventions, ordering and review of laboratory studies, ordering and review of radiographic studies, pulse oximetry, re-evaluation of patient's condition, obtaining history from patient or surrogate and review of old charts  .Sedation  Date/Time: 11/19/2019 7:21 PM Performed by: Maudie Flakes, MD Authorized by: Maudie Flakes, MD   Consent:    Consent obtained:  Verbal and written   Consent given by:  Patient   Risks discussed:  Allergic reaction, dysrhythmia, inadequate sedation, prolonged hypoxia resulting in organ damage and respiratory compromise necessitating ventilatory assistance and  intubation Universal protocol:    Immediately prior to procedure a time out was called: yes     Patient identity confirmation method:  Hospital-assigned identification number, arm band, provided demographic data and verbally with patient Indications:    Procedure performed:  Cardioversion Pre-sedation assessment:    Time since last food or drink:  2 hours   ASA classification: class 1 - normal, healthy patient     Neck mobility: normal     Mouth opening:  3 or more finger widths  Mallampati score:  I - soft palate, uvula, fauces, pillars visible   Pre-sedation assessments completed and reviewed: airway patency, cardiovascular function, mental status and respiratory function   Immediate pre-procedure details:    Reviewed: vital signs and relevant labs/tests     Verified: bag valve mask available, emergency equipment available, intubation equipment available, IV patency confirmed, oxygen available and suction available   Procedure details (see MAR for exact dosages):    Preoxygenation:  Nasal cannula   Sedation:  Etomidate   Intended level of sedation: deep   Intra-procedure monitoring:  Blood pressure monitoring, cardiac monitor, continuous capnometry, continuous pulse oximetry, frequent LOC assessments and frequent vital sign checks   Intra-procedure events: none     Total Provider sedation time (minutes):  23 Post-procedure details:    Post-sedation assessments completed and reviewed: airway patency, cardiovascular function, mental status and respiratory function     Patient is stable for discharge or admission: yes     Patient tolerance:  Tolerated well, no immediate complications Comments:     Patient given 10 mg etomidate.  He experienced significant full body myoclonus as he was emerging from the sedation. .Cardioversion  Date/Time: 11/19/2019 7:24 PM Performed by: Sabas SousBero, Nazly Digilio M, MD Authorized by: Sabas SousBero, Lyriq Jarchow M, MD   Consent:    Consent obtained:  Verbal and written    Consent given by:  Patient   Risks discussed:  Cutaneous burn, death, induced arrhythmia and pain Pre-procedure details:    Cardioversion basis:  Emergent   Rhythm:  Atrial fibrillation   Electrode placement:  Anterior-posterior Patient sedated: Yes. Refer to sedation procedure documentation for details of sedation.  Attempt one:    Cardioversion mode:  Synchronous   Waveform:  Biphasic   Shock (Joules):  100   Shock outcome:  No change in rhythm Attempt two:    Cardioversion mode:  Synchronous   Waveform:  Biphasic   Shock (Joules):  150   Shock outcome:  Conversion to normal sinus rhythm Post-procedure details:    Patient status:  Awake   Patient tolerance of procedure:  Tolerated well, no immediate complications    ED Course and Medical Decision Making  I have reviewed the triage vital signs and the nursing notes.  Pertinent labs & imaging results that were available during my care of the patient were reviewed by me and considered in my medical decision making (see below for details).     A. fib with RVR, which patient has had in the past.  Well-appearing, chest pain felt to be related to the RVR, doubt ACS, doubt PE.  Patient is a good candidate for cardioversion as he had a clear start of symptoms less than 24 hours ago.  Diltiazem dose here in the emergency department provided good rate control but did not convert.  Cardioversion as described above, successful conversion to normal sinus rhythm after 2 attempts.  Patient had some full body myoclonus as he was emerging from sedation, lasting briefly.  Anticipating discharge to A. fib clinic.  Per our protocol will start patient on Eliquis, which may be discontinued by cardiology given his low chads vas score.  This patients CHA2DS2-VASc Score and unadjusted Ischemic Stroke Rate (% per year) is equal to 0.2 % stroke rate/year from a score of 0  Above score calculated as 1 point each if present [CHF, HTN, DM,  Vascular=MI/PAD/Aortic Plaque, Age if 65-74, or Male] Above score calculated as 2 points each if present [Age > 75, or Stroke/TIA/TE]  Patient has  recovered well from sedation and has normal sinus rhythm, reassuring vital signs, resolution of all symptoms.  He is appropriate for discharge.    Elmer Sow. Pilar Plate, MD [facility] Emergency Medicine [facility] [email or web address]  Final Clinical Impressions(s) / ED Diagnoses     ICD-10-CM   1. Atrial fibrillation with rapid ventricular response (HCC)  I48.91     ED Discharge Orders         Ordered    apixaban (ELIQUIS) 5 MG TABS tablet  2 times daily     11/19/19 1942    Amb Referral to AFIB Clinic    Comments: Afib RVR cardioversion in the ED 12/6   11/19/19 1942           Discharge Instructions Discussed with and Provided to Patient:     Discharge Instructions     You were evaluated in the Emergency Department and after careful evaluation, we did not find any emergent condition requiring admission or further testing in the hospital.  Your symptoms seemed to be due to atrial fibrillation with a rapid heartbeat.  We were able to perform a cardioversion here in the emergency department and converted back to a normal rhythm.  As discussed, please take the blood thinner twice daily and follow-up closely with the cardiologist in the A. fib clinic.  Please return to the Emergency Department if you experience any worsening of your condition.  We encourage you to follow up with a primary care provider.  Thank you for allowing [place] to be a part of your care.       Sabas Sous, MD 11/19/19 1944

## 2019-11-19 NOTE — Sedation Documentation (Signed)
Pt cardioverted at 150 j. Pt presented with intense shaking. Unable to obtain a BP at this time.

## 2019-11-19 NOTE — Discharge Instructions (Signed)
You were evaluated in the Emergency Department and after careful evaluation, we did not find any emergent condition requiring admission or further testing in the hospital.  Your symptoms seemed to be due to atrial fibrillation with a rapid heartbeat.  We were able to perform a cardioversion here in the emergency department and converted back to a normal rhythm.  As discussed, please take the blood thinner twice daily and follow-up closely with the cardiologist in the A. fib clinic.  Please return to the Emergency Department if you experience any worsening of your condition.  We encourage you to follow up with a primary care provider.  Thank you for allowing [place] to be a part of your care.

## 2019-11-19 NOTE — ED Notes (Signed)
Patient verbalizes understanding of discharge instructions. Opportunity for questioning and answers were provided. Armband removed by staff, pt discharged from ED. Pt. ambulatory and discharged home.  

## 2019-11-19 NOTE — Sedation Documentation (Signed)
Pt cardioverted at 100 j. Noted to be in a-fib after first shock delivered.

## 2019-11-19 NOTE — ED Notes (Signed)
Samuel Herman, pt spouse [phone]

## 2019-11-19 NOTE — ED Triage Notes (Signed)
The pt has had chest pain and an irregular heart rate hx af  No son nausea or dizziness

## 2019-11-20 ENCOUNTER — Telehealth: Payer: Self-pay | Admitting: *Deleted

## 2019-11-20 NOTE — Telephone Encounter (Signed)
Pt wife called regarding prior auth needed for use of 30-day free savings card.  [facility] called [facility] and advised staff to run card separately- not through insurance for savings.  Pharm staff was able to honor savings card.

## 2019-11-23 ENCOUNTER — Ambulatory Visit (HOSPITAL_COMMUNITY)
Admission: RE | Admit: 2019-11-23 | Discharge: 2019-11-23 | Disposition: A | Discharge status: Home / Self Care | Payer: BC Managed Care – PPO | Source: Ambulatory Visit | Attending: Physician Assistant | Admitting: Physician Assistant

## 2019-11-23 ENCOUNTER — Other Ambulatory Visit: Payer: Self-pay

## 2019-11-23 VITALS — BP 114/74 | HR 83 | Ht 73.0 in | Wt 207.2 lb

## 2019-11-23 DIAGNOSIS — I480 Paroxysmal atrial fibrillation: Secondary | ICD-10-CM | POA: Insufficient documentation

## 2019-11-23 DIAGNOSIS — G4730 Sleep apnea, unspecified: Secondary | ICD-10-CM | POA: Diagnosis not present

## 2019-11-23 DIAGNOSIS — I4891 Unspecified atrial fibrillation: Secondary | ICD-10-CM

## 2019-11-23 DIAGNOSIS — Z7901 Long term (current) use of anticoagulants: Secondary | ICD-10-CM | POA: Diagnosis not present

## 2019-11-23 DIAGNOSIS — Z79899 Other long term (current) drug therapy: Secondary | ICD-10-CM | POA: Diagnosis not present

## 2019-11-23 DIAGNOSIS — F17210 Nicotine dependence, cigarettes, uncomplicated: Secondary | ICD-10-CM | POA: Insufficient documentation

## 2019-11-23 MED ORDER — DILTIAZEM HCL 30 MG PO TABS: ORAL_TABLET | ORAL | 1 refills | Status: DC

## 2019-11-23 NOTE — Patient Instructions (Signed)
Cardizem 30mg  -- take 1 tablet every 4 hours AS NEEDED for heart rate >100 as long as top  Number of blood pressure >100.   You may stop Eliquis after January 3rd   Sleep study ordered -- scheduling will contact after talking with your insurance

## 2019-11-23 NOTE — Progress Notes (Addendum)
Primary Care Physician: Dettinger, Fransisca Kaufmann, MD Primary Cardiologist: Dr Rockey Situ (remotely) Primary Electrophysiologist: Dr Caryl Comes (remotely) Referring Physician: [facility]   Samuel Herman is a 58 y.o. male with a history of paroxysmal atrial fibrillation and tobacco abuse who presents for consultation in the [facility].  The patient was initially diagnosed with atrial fibrillation in February 2010 and underwent DCCV at that time. He did well until 12/2011 when he developed afib with RVR in the setting of a viral illness and had TEE/DCCV. He was given Xarelto for 4 weeks post procedure. He has seen Dr Caryl Comes remotely. More recently, patient seen in the ER on 11/19/19 with a mild chest discomfort and heart racing. ECG confirmed afib with RVR and he was again cardioverted and started on anticoagulation. He denies any specific triggers. He does admit to snoring and witnessed apnea.   Today, he denies symptoms of palpitations, chest pain, shortness of breath, orthopnea, PND, lower extremity edema, dizziness, presyncope, syncope, bleeding, or neurologic sequela. The patient is tolerating medications without difficulties and is otherwise without complaint today.    Atrial Fibrillation Risk Factors:  he does have symptoms or diagnosis of sleep apnea. he does not have a history of rheumatic fever. he does have a history of alcohol use.   he has a BMI of Body mass index is 27.34 kg/m.Marland Kitchen Filed Weights   11/23/19 1502  Weight: 94 kg    Family History  Problem Relation Age of Onset  . Heart attack Father 46       deceased  . Cancer Mother        breast     Atrial Fibrillation Management history:  Previous antiarrhythmic drugs: none Previous cardioversions: 2010, 2013, 11/19/2019 Previous ablations: none CHADS2VASC score: 0 Anticoagulation history: Xarelto, Eliquis   Past Medical History:  Diagnosis Date  . Atrial fibrillation ([place])    DCCV 01/2009,  not on ASA or antiarrythmic  . Tobacco abuse    Past Surgical History:  Procedure Laterality Date  . CARDIOVERSION    . HERNIA REPAIR    . VASECTOMY      Current Outpatient Medications  Medication Sig Dispense Refill  . apixaban (ELIQUIS) 5 MG TABS tablet Take 1 tablet (5 mg total) by mouth 2 (two) times daily. 60 tablet 0  . Multiple Vitamin (MULTIVITAMIN WITH MINERALS) TABS tablet Take 1 tablet by mouth daily.    Marland Kitchen diltiazem (CARDIZEM) 30 MG tablet Take 1 tablet every 4 hours AS NEEDED for heart rate >100 45 tablet 1   No current facility-administered medications for this encounter.    Allergies  Allergen Reactions  . Other Other (See Comments)    There was an antibiotic that caused hives about 20 years ago.unsure of the name.    Social History   Socioeconomic History  . Marital status: Single    Spouse name: Not on file  . Number of children: Not on file  . Years of education: Not on file  . Highest education level: Not on file  Occupational History  . Occupation: Surveyor, quantity  Tobacco Use  . Smoking status: Current Every Day Smoker    Packs/day: 0.50    Types: Cigarettes  . Smokeless tobacco: Never Used  Substance and Sexual Activity  . Alcohol use: Yes    Alcohol/week: 2.0 standard drinks    Types: 2 Cans of beer per week  . Drug use: No  . Sexual activity: Yes    Comment:  married 34 years  Other Topics Concern  . Not on file  Social History Narrative  . Not on file   Social Determinants of Health   Financial Resource Strain:   . Difficulty of Paying Living Expenses: Not on file  Food Insecurity:   . Worried About Programme researcher, broadcasting/film/video in the Last Year: Not on file  . Ran Out of Food in the Last Year: Not on file  Transportation Needs:   . Lack of Transportation (Medical): Not on file  . Lack of Transportation (Non-Medical): Not on file  Physical Activity:   . Days of Exercise per Week: Not on file  . Minutes of Exercise per Session: Not on file   Stress:   . Feeling of Stress : Not on file  Social Connections:   . Frequency of Communication with Friends and Family: Not on file  . Frequency of Social Gatherings with Friends and Family: Not on file  . Attends Religious Services: Not on file  . Active Member of Clubs or Organizations: Not on file  . Attends Banker Meetings: Not on file  . Marital Status: Not on file  Intimate Partner Violence:   . Fear of Current or Ex-Partner: Not on file  . Emotionally Abused: Not on file  . Physically Abused: Not on file  . Sexually Abused: Not on file     ROS- All systems are reviewed and negative except as per the HPI above.  Physical Exam: Vitals:   11/23/19 1502  BP: 114/74  Pulse: 83  Weight: 94 kg  Height: 6\' 1"  (1.854 m)    GEN- The patient is well appearing, alert and oriented x 3 today.   Head- normocephalic, atraumatic Eyes-  Sclera clear, conjunctiva pink Ears- hearing intact Oropharynx- clear Neck- supple  Lungs- Clear to ausculation bilaterally, normal work of breathing Heart- Regular rate and rhythm, no murmurs, rubs or gallops  GI- soft, NT, ND, + BS Extremities- no clubbing, cyanosis, or edema MS- no significant deformity or atrophy Skin- no rash or lesion Psych- euthymic mood, full affect Neuro- strength and sensation are intact  Wt Readings from Last 3 Encounters:  11/23/19 94 kg  11/19/19 94.9 kg  11/23/18 94.9 kg    EKG today demonstrates SR HR 83, PR 152, QRS 86, QTc 411  Epic records are reviewed at length today  Assessment and Plan:  1. Paroxysmal atrial fibrillation S/p DCCV 11/19/19 General education about afib provided and questions answered. Will check echocardiogram. Will start diltiazem 30 mg PRN q4hrs for heart racing. Continue Eliquis for 4 weeks post DCCV with no missed doses. May stop after 12/17/19 given low CHADS2VASC score.  Lifestyle modification was discussed and encouraged including regular physical activity and  tobacco cessation.  This patients CHA2DS2-VASc Score and unadjusted Ischemic Stroke Rate (% per year) is equal to 0.2 % stroke rate/year from a score of 0  Above score calculated as 1 point each if present [CHF, HTN, DM, Vascular=MI/PAD/Aortic Plaque, Age if 65-74, or Male] Above score calculated as 2 points each if present [Age > 75, or Stroke/TIA/TE]   2. Snoring/witnessed apnea The importance of adequate treatment of sleep apnea was discussed today in order to improve our ability to maintain sinus rhythm long term. Will refer for sleep study.    Follow up in the AF clinic in one month.   02-24-2001 PA-C Afib Clinic [facility] [address] [place], [place] [phone] 11/23/2019 3:45 PM

## 2019-11-28 ENCOUNTER — Other Ambulatory Visit: Payer: Self-pay

## 2019-11-29 ENCOUNTER — Ambulatory Visit: Payer: BLUE CROSS/BLUE SHIELD | Admitting: Family Medicine

## 2019-12-01 ENCOUNTER — Ambulatory Visit (HOSPITAL_COMMUNITY)
Admission: RE | Admit: 2019-12-01 | Discharge: 2019-12-01 | Disposition: A | Discharge status: Home / Self Care | Payer: BC Managed Care – PPO | Source: Ambulatory Visit | Attending: Physician Assistant | Admitting: Physician Assistant

## 2019-12-01 ENCOUNTER — Other Ambulatory Visit: Payer: Self-pay

## 2019-12-01 DIAGNOSIS — I4891 Unspecified atrial fibrillation: Secondary | ICD-10-CM | POA: Insufficient documentation

## 2019-12-01 DIAGNOSIS — F17200 Nicotine dependence, unspecified, uncomplicated: Secondary | ICD-10-CM | POA: Diagnosis not present

## 2019-12-01 NOTE — Progress Notes (Signed)
  Echocardiogram 2D Echocardiogram has been performed.  Burnett Kanaris 12/01/2019, 3:20 PM

## 2019-12-04 ENCOUNTER — Encounter: Payer: Self-pay | Admitting: *Deleted

## 2019-12-04 ENCOUNTER — Other Ambulatory Visit: Payer: Self-pay

## 2019-12-05 ENCOUNTER — Encounter (HOSPITAL_COMMUNITY): Payer: Self-pay | Admitting: *Deleted

## 2019-12-05 ENCOUNTER — Encounter: Payer: Self-pay | Admitting: Family Medicine

## 2019-12-05 ENCOUNTER — Ambulatory Visit (INDEPENDENT_AMBULATORY_CARE_PROVIDER_SITE_OTHER): Payer: BC Managed Care – PPO | Admitting: Family Medicine

## 2019-12-05 VITALS — BP 102/65 | HR 75 | Temp 98.2°F | Resp 20 | Ht 73.0 in | Wt 208.0 lb

## 2019-12-05 DIAGNOSIS — Z1211 Encounter for screening for malignant neoplasm of colon: Secondary | ICD-10-CM | POA: Diagnosis not present

## 2019-12-05 DIAGNOSIS — Z0000 Encounter for general adult medical examination without abnormal findings: Secondary | ICD-10-CM

## 2019-12-05 DIAGNOSIS — Z6827 Body mass index (BMI) 27.0-27.9, adult: Secondary | ICD-10-CM | POA: Diagnosis not present

## 2019-12-05 DIAGNOSIS — Z125 Encounter for screening for malignant neoplasm of prostate: Secondary | ICD-10-CM | POA: Diagnosis not present

## 2019-12-05 DIAGNOSIS — Z1212 Encounter for screening for malignant neoplasm of rectum: Secondary | ICD-10-CM

## 2019-12-05 NOTE — Patient Instructions (Addendum)
Health Maintenance, Male Adopting a healthy lifestyle and getting preventive care are important in promoting health and wellness. Ask your health care provider about:  The right schedule for you to have regular tests and exams.  Things you can do on your own to prevent diseases and keep yourself healthy. What should I know about diet, weight, and exercise? Eat a healthy diet   Eat a diet that includes plenty of vegetables, fruits, low-fat dairy products, and lean protein.  Do not eat a lot of foods that are high in solid fats, added sugars, or sodium. Maintain a healthy weight Body mass index (BMI) is a measurement that can be used to identify possible weight problems. It estimates body fat based on height and weight. Your health care provider can help determine your BMI and help you achieve or maintain a healthy weight. Get regular exercise Get regular exercise. This is one of the most important things you can do for your health. Most adults should:  Exercise for at least 150 minutes each week. The exercise should increase your heart rate and make you sweat (moderate-intensity exercise).  Do strengthening exercises at least twice a week. This is in addition to the moderate-intensity exercise.  Spend less time sitting. Even light physical activity can be beneficial. Watch cholesterol and blood lipids Have your blood tested for lipids and cholesterol at 59 years of age, then have this test every 5 years. You may need to have your cholesterol levels checked more often if:  Your lipid or cholesterol levels are high.  You are older than 59 years of age.  You are at high risk for heart disease. What should I know about cancer screening? Many types of cancers can be detected early and may often be prevented. Depending on your health history and family history, you may need to have cancer screening at various ages. This may include screening for:  Colorectal cancer.  Prostate  cancer.  Skin cancer.  Lung cancer. What should I know about heart disease, diabetes, and high blood pressure? Blood pressure and heart disease  High blood pressure causes heart disease and increases the risk of stroke. This is more likely to develop in people who have high blood pressure readings, are of African descent, or are overweight.  Talk with your health care provider about your target blood pressure readings.  Have your blood pressure checked: ? Every 3-5 years if you are 68-6 years of age. ? Every year if you are 43 years old or older.  If you are between the ages of 18 and 53 and are a current or former smoker, ask your health care provider if you should have a one-time screening for abdominal aortic aneurysm (AAA). Diabetes Have regular diabetes screenings. This checks your fasting blood sugar level. Have the screening done:  Once every three years after age 18 if you are at a normal weight and have a low risk for diabetes.  More often and at a younger age if you are overweight or have a high risk for diabetes. What should I know about preventing infection? Hepatitis B If you have a higher risk for hepatitis B, you should be screened for this virus. Talk with your health care provider to find out if you are at risk for hepatitis B infection. Hepatitis C Blood testing is recommended for:  Everyone born from [birth year] through 1966.  Anyone with known risk factors for hepatitis C. Sexually transmitted infections (STIs)  You should be screened each  year for STIs, including gonorrhea and chlamydia, if: ? You are sexually active and are younger than 59 years of age. ? You are older than 59 years of age and your health care provider tells you that you are at risk for this type of infection. ? Your sexual activity has changed since you were last screened, and you are at increased risk for chlamydia or gonorrhea. Ask your health care provider if you are at risk.  Ask your  health care provider about whether you are at high risk for HIV. Your health care provider may recommend a prescription medicine to help prevent HIV infection. If you choose to take medicine to prevent HIV, you should first get tested for HIV. You should then be tested every 3 months for as long as you are taking the medicine. Follow these instructions at home: Lifestyle  Do not use any products that contain nicotine or tobacco, such as cigarettes, e-cigarettes, and chewing tobacco. If you need help quitting, ask your health care provider.  Do not use street drugs.  Do not share needles.  Ask your health care provider for help if you need support or information about quitting drugs. Alcohol use  Do not drink alcohol if your health care provider tells you not to drink.  If you drink alcohol: ? Limit how much you have to 0-2 drinks a day. ? Be aware of how much alcohol is in your drink. In the [place] one drink equals one 12 oz bottle of beer (355 mL), one 5 oz glass of wine (148 mL), or one 1 oz glass of hard liquor (44 mL). General instructions  Schedule regular health, dental, and eye exams.  Stay current with your vaccines.  Tell your health care provider if: ? You often feel depressed. ? You have ever been abused or do not feel safe at home. Summary  Adopting a healthy lifestyle and getting preventive care are important in promoting health and wellness.  Follow your health care provider's instructions about healthy diet, exercising, and getting tested or screened for diseases.  Follow your health care provider's instructions on monitoring your cholesterol and blood pressure. This information is not intended to replace advice given to you by your health care provider. Make sure you discuss any questions you have with your health care provider. Document Released: 05/28/2008 Document Revised: 11/23/2018 Document Reviewed: 11/23/2018 Elsevier Patient Education  2020 [place]  Your provider has prescribed Cologuard, an easy-to-use, noninvasive test for colon cancer screening, based on the latest advances in stool DNA science.   Here's what will happen next:  1. You may receive a call or email from [employer] to confirm your mailing address and insurance information 2. Your kit will be shipped directly to you 3. You collect your stool sample in the privacy of your own home. Please follow the instructions that come with the kit 4. You return the kit via [facility] shipping or pick-up, in the same box it arrived in 5. You should receive a call with the results once they are available. If you do not receive a call, please contact our office at [phone]  Insurance Coverage  Cologuard is covered by Medicare and most major insurers Cologuard is covered by Medicare and Medicare Advantage with no co-pay or deductible for eligible patients ages 71-85. Nationwide, more than 94% of Cologuard patients have no out-of-pocket cost for screening. . Based on the [place] should be covered  by most private insurers with no co-pay or deductible for eligible patients (ages 60-75; at average risk for colon cancer; without symptoms). Currently, ~74% of Cologuard patients 45-49 have had no out-of-pocket cost for screening.  . Many national and regional payers have begun paying for CRC screening at 25. [employer] continues to work with payers to expand coverage and access for patients ages 50-49.   Only your healthcare insurance provider can confirm how Cologuard will be covered for you. If you have questions about coverage, you can contact your insurance company directly or ask the specialists at [employer] to do that for you. A Customer Support Specialist can be reached at [phone].    Patient Support Screening for colon cancer is very important to your good health, so if you have any questions at all, please call  [facility] Customer Support Specialists at [phone]. They are available 24 hours a day, 6 days a week. An instructional video is available to view online at [email or web address]   *Information and graphics obtained from [email or web address]

## 2019-12-05 NOTE — Progress Notes (Signed)
Subjective:  Patient ID: Samuel Herman, male    DOB: 08-Jan-1961, 58 y.o.   MRN: [id number]  Patient Care Team: Dettinger, Elige RadonJoshua A, MD as PCP - General (Family Medicine)   Chief Complaint:  Annual Exam (CPE )   HPI: Samuel Herman is a 58 y.o. male presenting on 12/05/2019 for Annual Exam (CPE )   Pt presents today for his annual physical exam. He reports he is doing very well overall. He does have A-Fib and is followed by cardiology on a regular basis. He had an echo on 12/01/2019 and is to follow up with cardiology to go over results. Pt states he is compliant with his Eliquis and Cardizem. He denies chest pain, shortness or breath, palpitations, dizziness, or syncope. He is still smoking, 1 PPD, trying to cut back but not ready to quit.  Pt has yet to have colonoscopy and is not willing to have completed at this time. Discussed cologuard in detail and pt agrees to this test. He does not have any first degree relatives with CRC or personal history of cancer. No rectal bleeding or stool abnormalities.     Relevant past medical, surgical, family, and social history reviewed and updated as indicated.  Allergies and medications reviewed and updated. Date reviewed: Chart in Epic.   Past Medical History:  Diagnosis Date  . Atrial fibrillation (HCC)    DCCV 01/2009, not on ASA or antiarrythmic  . Tobacco abuse     Past Surgical History:  Procedure Laterality Date  . CARDIOVERSION    . HERNIA REPAIR    . VASECTOMY      Social History   Socioeconomic History  . Marital status: Single    Spouse name: Not on file  . Number of children: Not on file  . Years of education: Not on file  . Highest education level: Not on file  Occupational History  . 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 Smoking status: Current Every Day Smoker    Packs/day: 0.50    Types: Cigarettes  . Smokeless tobacco: Never Used  Substance and Sexual Activity  . Alcohol use: Yes    Alcohol/week:  2.0 standard drinks    Types: 2 Cans of beer per week  . Drug use: No  . Sexual activity: Yes    Comment: married 34 years  Other Topics Concern  . Not on file  Social History Narrative  . Not on file   Social Determinants of Health   Financial Resource Strain:   . Difficulty of Paying Living Expenses: Not on file  Food Insecurity:   . Worried About Programme researcher, broadcasting/film/videounning Out of Food in the Last Year: Not on file  . Ran Out of Food in the Last Year: Not on file  Transportation Needs:   . Lack of Transportation (Medical): Not on file  . Lack of Transportation (Non-Medical): Not on file  Physical Activity:   . Days of Exercise per Week: Not on file  . Minutes of Exercise per Session: Not on file  Stress:   . Feeling of Stress : Not on file  Social Connections:   . Frequency of Communication with Friends and Family: Not on file  . Frequency of Social Gatherings with Friends and Family: Not on file  . Attends Religious Services: Not on file  . Active Member of Clubs or Organizations: Not on file  . Attends BankerClub or Organization Meetings: Not on file  . Marital Status: Not on file  Intimate Partner Violence:   . Fear of Current or Ex-Partner: Not on file  . Emotionally Abused: Not on file  . Physically Abused: Not on file  . Sexually Abused: Not on file    Outpatient Encounter Medications as of 12/05/2019  Medication Sig  . apixaban (ELIQUIS) 5 MG TABS tablet Take 1 tablet (5 mg total) by mouth 2 (two) times daily.  . Multiple Vitamin (MULTIVITAMIN WITH MINERALS) TABS tablet Take 1 tablet by mouth daily.  Marland Kitchen diltiazem (CARDIZEM) 30 MG tablet Take 1 tablet every 4 hours AS NEEDED for heart rate >100 (Patient not taking: Reported on 12/05/2019)   No facility-administered encounter medications on file as of 12/05/2019.    Allergies  Allergen Reactions  . Other Other (See Comments)    There was an antibiotic that caused hives about 20 years ago.unsure of the name.    Review of Systems    Constitutional: Negative for activity change, appetite change, chills, diaphoresis, fatigue, fever and unexpected weight change.  HENT: Negative.   Eyes: Negative.  Negative for photophobia and visual disturbance.  Respiratory: Negative for cough, chest tightness, shortness of breath and wheezing.   Cardiovascular: Negative for chest pain, palpitations and leg swelling.  Gastrointestinal: Negative for abdominal pain, anal bleeding, blood in stool, constipation, diarrhea, nausea and vomiting.  Endocrine: Negative.  Negative for polydipsia, polyphagia and polyuria.  Genitourinary: Negative for decreased urine volume, difficulty urinating, discharge, dysuria, enuresis, flank pain, frequency, genital sores, hematuria, penile pain, penile swelling, scrotal swelling, testicular pain and urgency.  Musculoskeletal: Negative for arthralgias and myalgias.  Skin: Negative.   Allergic/Immunologic: Negative.   Neurological: Negative for dizziness, tremors, seizures, syncope, facial asymmetry, speech difficulty, weakness, light-headedness, numbness and headaches.  Hematological: Negative.  Does not bruise/bleed easily.  Psychiatric/Behavioral: Negative for confusion, hallucinations, sleep disturbance and suicidal ideas.  All other systems reviewed and are negative.       Objective:  BP 102/65   Pulse 75   Temp 98.2 F (36.8 C)   Resp 20   Ht 6\' 1"  (1.854 m)   Wt 208 lb (94.3 kg)   SpO2 98%   BMI 27.44 kg/m    Wt Readings from Last 3 Encounters:  12/05/19 208 lb (94.3 kg)  11/23/19 207 lb 3.2 oz (94 kg)  11/19/19 209 lb 3.5 oz (94.9 kg)    Physical Exam Vitals and nursing note reviewed.  Constitutional:      General: He is not in acute distress.    Appearance: Normal appearance. He is well-developed and well-groomed. He is not ill-appearing, toxic-appearing or diaphoretic.  HENT:     Head: Normocephalic and atraumatic.     Jaw: There is normal jaw occlusion.     Right Ear: Hearing,  tympanic membrane, ear canal and external ear normal. There is no impacted cerumen.     Left Ear: Hearing, tympanic membrane, ear canal and external ear normal. There is no impacted cerumen.     Nose: Nose normal.     Mouth/Throat:     Lips: Pink.     Mouth: Mucous membranes are moist.     Pharynx: Oropharynx is clear. Uvula midline.  Eyes:     General: Lids are normal.     Extraocular Movements: Extraocular movements intact.     Conjunctiva/sclera: Conjunctivae normal.     Pupils: Pupils are equal, round, and reactive to light.  Neck:     Thyroid: No thyroid mass, thyromegaly or thyroid tenderness.     Vascular: No  carotid bruit or JVD.     Trachea: Trachea and phonation normal.  Cardiovascular:     Rate and Rhythm: Normal rate. Rhythm irregularly irregular.     Chest Wall: PMI is not displaced.     Pulses: Normal pulses.     Heart sounds: Normal heart sounds. No murmur. No friction rub. No gallop.   Pulmonary:     Effort: Pulmonary effort is normal. No respiratory distress.     Breath sounds: Normal breath sounds. No wheezing.  Abdominal:     General: Bowel sounds are normal. There is no distension or abdominal bruit.     Palpations: Abdomen is soft. There is no hepatomegaly or splenomegaly.     Tenderness: There is no abdominal tenderness. There is no right CVA tenderness or left CVA tenderness.     Hernia: No hernia is present.  Musculoskeletal:        General: Normal range of motion.     Cervical back: Normal range of motion and neck supple.     Right lower leg: No edema.     Left lower leg: No edema.  Lymphadenopathy:     Cervical: No cervical adenopathy.  Skin:    General: Skin is warm and dry.     Capillary Refill: Capillary refill takes less than 2 seconds.     Coloration: Skin is not cyanotic, jaundiced or pale.     Findings: No rash.  Neurological:     General: No focal deficit present.     Mental Status: He is alert and oriented to person, place, and time.      Cranial Nerves: Cranial nerves are intact. No cranial nerve deficit.     Sensory: Sensation is intact. No sensory deficit.     Motor: Motor function is intact. No weakness.     Coordination: Coordination is intact. Coordination normal.     Gait: Gait is intact. Gait normal.     Deep Tendon Reflexes: Reflexes are normal and symmetric. Reflexes normal.  Psychiatric:        Attention and Perception: Attention and perception normal.        Mood and Affect: Mood and affect normal.        Speech: Speech normal.        Behavior: Behavior normal. Behavior is cooperative.        Thought Content: Thought content normal.        Cognition and Memory: Cognition and memory normal.        Judgment: Judgment normal.     Results for orders placed or performed during the hospital encounter of 85/02/77  Basic metabolic panel  Result Value Ref Range   Sodium 139 135 - 145 mmol/L   Potassium 4.0 3.5 - 5.1 mmol/L   Chloride 110 98 - 111 mmol/L   CO2 20 (L) 22 - 32 mmol/L   Glucose, Bld 93 70 - 99 mg/dL   BUN 11 6 - 20 mg/dL   Creatinine, Ser 1.09 0.61 - 1.24 mg/dL   Calcium 9.3 8.9 - 10.3 mg/dL   GFR calc non Af Amer >60 >60 mL/min   GFR calc Af Amer >60 >60 mL/min   Anion gap 9 5 - 15  CBC  Result Value Ref Range   WBC 8.8 4.0 - 10.5 K/uL   RBC 4.62 4.22 - 5.81 MIL/uL   Hemoglobin 14.9 13.0 - 17.0 g/dL   HCT 43.7 39.0 - 52.0 %   MCV 94.6 80.0 - 100.0 fL  MCH 32.3 26.0 - 34.0 pg   MCHC 34.1 30.0 - 36.0 g/dL   RDW 45.4 09.8 - 11.9 %   Platelets 207 150 - 400 K/uL   nRBC 0.0 0.0 - 0.2 %  Troponin I (High Sensitivity)  Result Value Ref Range   Troponin I (High Sensitivity) 6 <18 ng/L       Pertinent labs & imaging results that were available during my care of the patient were reviewed by me and considered in my medical decision making.  Assessment & Plan:  Laquan was seen today for annual exam.  Diagnoses and all orders for this visit:  Routine general medical examination at a health  care facility Health maintenance discussed. Labs pending. Diet and exercise encouraged.  -     TSH -     CBC with Differential -     Comprehensive metabolic panel -     Lipid panel -     Cologuard -     PSA, total and free  BMI 27.0-27.9,adult Diet and exercise discussed in detail. Labs pending.  -     TSH -     CBC with Differential -     Comprehensive metabolic panel -     Lipid panel  Screening for colorectal cancer Pt agreed to cologuard for CRC screening. Pt is not high risk.  -     Cologuard  Screening for prostate cancer -     PSA, total and free     Continue all other maintenance medications.  Follow up plan: Return in 1 year (on 12/04/2020), or if symptoms worsen or fail to improve.  Continue healthy lifestyle choices, including diet (rich in fruits, vegetables, and lean proteins, and low in salt and simple carbohydrates) and exercise (at least 30 minutes of moderate physical activity daily).  Educational handout given for health maintenance, cologuard  The above assessment and management plan was discussed with the patient. The patient verbalized understanding of and has agreed to the management plan. Patient is aware to call the clinic if they develop any new symptoms or if symptoms persist or worsen. Patient is aware when to return to the clinic for a follow-up visit. Patient educated on when it is appropriate to go to the emergency department.   Kari Baars, FNP-C [facility] [phone]

## 2019-12-06 LAB — CBC WITH DIFFERENTIAL/PLATELET
Basophils Absolute: 0.1 10*3/uL (ref 0.0–0.2)
Basos: 1 %
EOS (ABSOLUTE): 0.2 10*3/uL (ref 0.0–0.4)
Eos: 2 %
Hematocrit: 42 % (ref 37.5–51.0)
Hemoglobin: 14.6 g/dL (ref 13.0–17.7)
Immature Grans (Abs): 0.1 10*3/uL (ref 0.0–0.1)
Immature Granulocytes: 1 %
Lymphocytes Absolute: 1.6 10*3/uL (ref 0.7–3.1)
Lymphs: 21 %
MCH: 33.3 pg — ABNORMAL HIGH (ref 26.6–33.0)
MCHC: 34.8 g/dL (ref 31.5–35.7)
MCV: 96 fL (ref 79–97)
Monocytes Absolute: 0.6 10*3/uL (ref 0.1–0.9)
Monocytes: 7 %
Neutrophils Absolute: 5.3 10*3/uL (ref 1.4–7.0)
Neutrophils: 68 %
Platelets: 224 10*3/uL (ref 150–450)
RBC: 4.39 x10E6/uL (ref 4.14–5.80)
RDW: 12.5 % (ref 11.6–15.4)
WBC: 7.7 10*3/uL (ref 3.4–10.8)

## 2019-12-06 LAB — TSH: TSH: 2.72 u[IU]/mL (ref 0.450–4.500)

## 2019-12-06 LAB — COMPREHENSIVE METABOLIC PANEL
ALT: 18 IU/L (ref 0–44)
AST: 20 IU/L (ref 0–40)
Albumin/Globulin Ratio: 1.6 (ref 1.2–2.2)
Albumin: 4.2 g/dL (ref 3.8–4.9)
Alkaline Phosphatase: 54 IU/L (ref 39–117)
BUN/Creatinine Ratio: 16 (ref 9–20)
BUN: 16 mg/dL (ref 6–24)
Bilirubin Total: 0.5 mg/dL (ref 0.0–1.2)
CO2: 23 mmol/L (ref 20–29)
Calcium: 9.5 mg/dL (ref 8.7–10.2)
Chloride: 105 mmol/L (ref 96–106)
Creatinine, Ser: 1.01 mg/dL (ref 0.76–1.27)
GFR calc Af Amer: 94 mL/min/{1.73_m2} (ref >59)
GFR calc non Af Amer: 81 mL/min/{1.73_m2} (ref >59)
Globulin, Total: 2.6 g/dL (ref 1.5–4.5)
Glucose: 92 mg/dL (ref 65–99)
Potassium: 4.5 mmol/L (ref 3.5–5.2)
Sodium: 143 mmol/L (ref 134–144)
Total Protein: 6.8 g/dL (ref 6.0–8.5)

## 2019-12-06 LAB — PSA, TOTAL AND FREE
PSA, Free Pct: 57.1 %
PSA, Free: 0.4 ng/mL
Prostate Specific Ag, Serum: 0.7 ng/mL (ref 0.0–4.0)

## 2019-12-06 LAB — LIPID PANEL
Chol/HDL Ratio: 3.2 ratio (ref 0.0–5.0)
Cholesterol, Total: 110 mg/dL (ref 100–199)
HDL: 34 mg/dL — ABNORMAL LOW (ref >39)
LDL Chol Calc (NIH): 47 mg/dL (ref 0–99)
Triglycerides: 174 mg/dL — ABNORMAL HIGH (ref 0–149)
VLDL Cholesterol Cal: 29 mg/dL (ref 5–40)

## 2019-12-06 NOTE — Progress Notes (Signed)
CBC normal. Slight variations in [facility] is normal.  PSA is normal.  Thyroid function is normal. Liver and kidney functions are normal. Glucose is normal.  Triglycerides are elevated at 174. Need to get this down by avoiding fried, greasy, fatty, and fast foods. Will recheck in 9-12 months. Can take red yeast rice 2400 mg daily, this is over the counter.

## 2019-12-20 ENCOUNTER — Ambulatory Visit: Payer: Self-pay | Admitting: Family Medicine

## 2019-12-21 ENCOUNTER — Telehealth: Payer: Self-pay | Admitting: Family Medicine

## 2019-12-21 NOTE — Telephone Encounter (Signed)
Spoke with Joni Reining from [employer] who stated that they have received 2 [employer] Orders within 1 year and wants to confirm cancellation on the one that Dr Dettinger sent in Jan 2020 and use the one that Dr Reginia Forts sent Dec 2020.  Reference # [id number]

## 2019-12-21 NOTE — Telephone Encounter (Signed)
[employer] aware and verbalized understanding

## 2019-12-21 NOTE — Telephone Encounter (Signed)
That sounds good, go ahead and send the cancellation in for the second 1, they will need 1.  We likely did not have the result back before we sent the other one.

## 2019-12-25 ENCOUNTER — Other Ambulatory Visit: Payer: Self-pay

## 2019-12-25 ENCOUNTER — Ambulatory Visit (HOSPITAL_COMMUNITY)
Admission: RE | Admit: 2019-12-25 | Discharge: 2019-12-25 | Disposition: A | Discharge status: Home / Self Care | Payer: BC Managed Care – PPO | Source: Ambulatory Visit | Attending: Physician Assistant | Admitting: Physician Assistant

## 2019-12-25 VITALS — BP 120/68 | HR 74 | Ht 73.0 in | Wt 205.0 lb

## 2019-12-25 DIAGNOSIS — F17210 Nicotine dependence, cigarettes, uncomplicated: Secondary | ICD-10-CM | POA: Insufficient documentation

## 2019-12-25 DIAGNOSIS — G4730 Sleep apnea, unspecified: Secondary | ICD-10-CM | POA: Insufficient documentation

## 2019-12-25 DIAGNOSIS — I480 Paroxysmal atrial fibrillation: Secondary | ICD-10-CM

## 2019-12-25 DIAGNOSIS — Z79899 Other long term (current) drug therapy: Secondary | ICD-10-CM | POA: Diagnosis not present

## 2019-12-25 DIAGNOSIS — Z7901 Long term (current) use of anticoagulants: Secondary | ICD-10-CM | POA: Insufficient documentation

## 2019-12-25 MED ORDER — DILTIAZEM HCL 30 MG PO TABS: ORAL_TABLET | ORAL | 1 refills | Status: DC

## 2019-12-25 NOTE — Progress Notes (Signed)
Primary Care Physician: Dettinger, Fransisca Kaufmann, MD Primary Cardiologist: Dr Rockey Situ (remotely) Primary Electrophysiologist: Dr Caryl Comes (remotely) Referring Physician: [facility]   Samuel Herman is a 59 y.o. male with a history of paroxysmal atrial fibrillation and tobacco abuse who presents for follow up in the [facility].  The patient was initially diagnosed with atrial fibrillation in February 2010 and underwent DCCV at that time. He did well until 12/2011 when he developed afib with RVR in the setting of a viral illness and had TEE/DCCV. He was given Xarelto for 4 weeks post procedure. He has seen Dr Caryl Comes remotely. More recently, patient seen in the ER on 11/19/19 with a mild chest discomfort and heart racing. ECG confirmed afib with RVR and he was again cardioverted and started on anticoagulation. He denies any specific triggers. He does admit to snoring and witnessed apnea.   On follow up today, patient reports he has done well since his last visit with no heart racing or palpitations. He did have one episode of dizziness and sweating after standing up but this resolved. No other symptoms. He stopped the Eliquis 4 weeks post DCCV.  Today, he denies symptoms of palpitations, chest pain, shortness of breath, orthopnea, PND, lower extremity edema, dizziness, presyncope, syncope, bleeding, or neurologic sequela. The patient is tolerating medications without difficulties and is otherwise without complaint today.    Atrial Fibrillation Risk Factors:  he does have symptoms or diagnosis of sleep apnea. he does not have a history of rheumatic fever. he does have a history of alcohol use.   he has a BMI of Body mass index is 27.05 kg/m.Marland Kitchen Filed Weights   12/25/19 1457  Weight: 93 kg    Family History  Problem Relation Age of Onset  . Heart attack Father 8       deceased  . Cancer Mother        breast     Atrial Fibrillation Management  history:  Previous antiarrhythmic drugs: none Previous cardioversions: 2010, 2013, 11/19/2019 Previous ablations: none CHADS2VASC score: 0 Anticoagulation history: Xarelto, Eliquis   Past Medical History:  Diagnosis Date  . Atrial fibrillation ([place])    DCCV 01/2009, not on ASA or antiarrythmic  . Tobacco abuse    Past Surgical History:  Procedure Laterality Date  . CARDIOVERSION    . HERNIA REPAIR    . VASECTOMY      Current Outpatient Medications  Medication Sig Dispense Refill  . diltiazem (CARDIZEM) 30 MG tablet Take 1 tablet every 4 hours AS NEEDED for heart rate >100 45 tablet 1  . Multiple Vitamin (MULTIVITAMIN WITH MINERALS) TABS tablet Take 1 tablet by mouth daily.    Marland Kitchen apixaban (ELIQUIS) 5 MG TABS tablet Take 1 tablet (5 mg total) by mouth 2 (two) times daily. (Patient not taking: Reported on 12/25/2019) 60 tablet 0   No current facility-administered medications for this encounter.    Allergies  Allergen Reactions  . Other Other (See Comments)    There was an antibiotic that caused hives about 20 years ago.unsure of the name.    Social History   Socioeconomic History  . Marital status: Single    Spouse name: Not on file  . Number of children: Not on file  . Years of education: Not on file  . Highest education level: Not on file  Occupational History  . Occupation: Surveyor, quantity  Tobacco Use  . Smoking status: Current Every Day Smoker  Packs/day: 0.50    Types: Cigarettes  . Smokeless tobacco: Never Used  Substance and Sexual Activity  . Alcohol use: Yes    Alcohol/week: 2.0 standard drinks    Types: 2 Cans of beer per week  . Drug use: No  . Sexual activity: Yes    Comment: married 34 years  Other Topics Concern  . Not on file  Social History Narrative  . Not on file   Social Determinants of Health   Financial Resource Strain:   . Difficulty of Paying Living Expenses: Not on file  Food Insecurity:   . Worried About Programme researcher, broadcasting/film/video in the  Last Year: Not on file  . Ran Out of Food in the Last Year: Not on file  Transportation Needs:   . Lack of Transportation (Medical): Not on file  . Lack of Transportation (Non-Medical): Not on file  Physical Activity:   . Days of Exercise per Week: Not on file  . Minutes of Exercise per Session: Not on file  Stress:   . Feeling of Stress : Not on file  Social Connections:   . Frequency of Communication with Friends and Family: Not on file  . Frequency of Social Gatherings with Friends and Family: Not on file  . Attends Religious Services: Not on file  . Active Member of Clubs or Organizations: Not on file  . Attends Banker Meetings: Not on file  . Marital Status: Not on file  Intimate Partner Violence:   . Fear of Current or Ex-Partner: Not on file  . Emotionally Abused: Not on file  . Physically Abused: Not on file  . Sexually Abused: Not on file     ROS- All systems are reviewed and negative except as per the HPI above.  Physical Exam: Vitals:   12/25/19 1457  BP: 120/68  Pulse: 74  Weight: 93 kg  Height: 6\' 1"  (1.854 m)    GEN- The patient is well appearing, alert and oriented x 3 today.   HEENT-head normocephalic, atraumatic, sclera clear, conjunctiva pink, hearing intact, trachea midline. Lungs- Clear to ausculation bilaterally, normal work of breathing Heart- Regular rate and rhythm, no murmurs, rubs or gallops  GI- soft, NT, ND, + BS Extremities- no clubbing, cyanosis, or edema MS- no significant deformity or atrophy Skin- no rash or lesion Psych- euthymic mood, full affect Neuro- strength and sensation are intact   Wt Readings from Last 3 Encounters:  12/25/19 93 kg  12/05/19 94.3 kg  11/23/19 94 kg    EKG today demonstrates SR HR 74, PR 156, QRS 92, QTc 408  Echo 12/01/19 1. Left ventricular ejection fraction, by visual estimation, is 55 to 60%. The left ventricle has normal function. There is no left ventricular hypertrophy.  2. Left  ventricular diastolic parameters are consistent with Grade I diastolic dysfunction (impaired relaxation).  3. Mildly dilated left ventricular internal cavity size.  4. The left ventricle has no regional wall motion abnormalities.  5. Global right ventricle has normal systolic function.The right ventricular size is normal. No increase in right ventricular wall thickness.  6. Left atrial size was normal.  7. Right atrial size was normal.  8. The mitral valve is normal in structure. Trivial mitral valve regurgitation. No evidence of mitral stenosis.  9. The tricuspid valve is normal in structure. Tricuspid valve regurgitation is trivial. 10. The aortic valve is normal in structure. Aortic valve regurgitation is not visualized. No evidence of aortic valve sclerosis or stenosis. 11.  The pulmonic valve was normal in structure. Pulmonic valve regurgitation is not visualized. 12. Normal pulmonary artery systolic pressure. 13. The inferior vena cava is normal in size with greater than 50% respiratory variability, suggesting right atrial pressure of 3 mmHg.  Epic records are reviewed at length today  Assessment and Plan:  1. Paroxysmal atrial fibrillation S/p DCCV 11/19/19 Patient appears to be maintaining SR. Continue diltiazem 30 mg PRN q4hrs for heart racing. Continue Eliquis for 4 weeks post DCCV with no missed doses. May stop after 12/17/19 given low CHADS2VASC score.  Lifestyle modification was discussed and encouraged including regular physical activity, tobacco cessation, and avoiding alcohol.   This patients CHA2DS2-VASc Score and unadjusted Ischemic Stroke Rate (% per year) is equal to 0.2 % stroke rate/year from a score of 0  Above score calculated as 1 point each if present [CHF, HTN, DM, Vascular=MI/PAD/Aortic Plaque, Age if 65-74, or Male] Above score calculated as 2 points each if present [Age > 75, or Stroke/TIA/TE]   2. Snoring/witnessed apnea The importance of adequate treatment  of sleep apnea was discussed today in order to improve our ability to maintain sinus rhythm long term. Referred for sleep study.   Follow up in the AF clinic in 6 months.    Jorja Loa PA-C [facility] [address] [place], [place] [address] [phone] 12/25/2019 4:42 PM

## 2020-09-10 ENCOUNTER — Encounter: Payer: Self-pay | Admitting: *Deleted

## 2020-11-28 ENCOUNTER — Encounter: Payer: Self-pay | Admitting: Family Medicine

## 2020-11-28 ENCOUNTER — Ambulatory Visit (INDEPENDENT_AMBULATORY_CARE_PROVIDER_SITE_OTHER): Payer: BC Managed Care – PPO | Admitting: Family Medicine

## 2020-11-28 ENCOUNTER — Other Ambulatory Visit: Payer: Self-pay

## 2020-11-28 VITALS — BP 117/73 | HR 76 | Temp 97.0°F | Ht 73.0 in | Wt 201.0 lb

## 2020-11-28 DIAGNOSIS — Z125 Encounter for screening for malignant neoplasm of prostate: Secondary | ICD-10-CM | POA: Diagnosis not present

## 2020-11-28 DIAGNOSIS — E781 Pure hyperglyceridemia: Secondary | ICD-10-CM | POA: Diagnosis not present

## 2020-11-28 DIAGNOSIS — Z0000 Encounter for general adult medical examination without abnormal findings: Secondary | ICD-10-CM

## 2020-11-28 DIAGNOSIS — Z0001 Encounter for general adult medical examination with abnormal findings: Secondary | ICD-10-CM | POA: Diagnosis not present

## 2020-11-28 NOTE — Progress Notes (Signed)
BP 117/73   Pulse 76   Temp (!) 97 F (36.1 C)   Ht 6' 1"  (1.854 m)   Wt 201 lb (91.2 kg)   SpO2 100%   BMI 26.52 kg/m    Subjective:   Patient ID: Samuel Herman, male    DOB: 18-Sep-1961, 59 y.o.   MRN: [id number]  HPI: Samuel Herman is a 59 y.o. male presenting on 11/28/2020 for Medical Management of Chronic Issues (CPE)   HPI Patient is coming in for adult well exam and physical.  Patient denies any major issues except for he did have a little bit in the area with low-grade fever but he denies any currently.  He does have a Cologuard at home and he says it still current but has not done it yet but he will do it now.  He has been seeing a cardiologist A. fib that was diagnosed at 1 point but has not been showing on repeat.  Patient denies any chest pain or palpitations.  He does state that he was partying that weekend and may have been why it up but he has not had anything like that since.  Relevant past medical, surgical, family and social history reviewed and updated as indicated. Interim medical history since our last visit reviewed. Allergies and medications reviewed and updated.  Review of Systems  Constitutional: Negative for chills and fever.  HENT: Negative for ear pain and tinnitus.   Eyes: Negative for pain.  Respiratory: Negative for cough, shortness of breath and wheezing.   Cardiovascular: Negative for chest pain, palpitations and leg swelling.  Gastrointestinal: Negative for abdominal pain, blood in stool, constipation and diarrhea.  Genitourinary: Negative for dysuria and hematuria.  Musculoskeletal: Negative for back pain and myalgias.  Skin: Negative for rash.  Neurological: Negative for dizziness, weakness and headaches.  Psychiatric/Behavioral: Negative for suicidal ideas.    Per HPI unless specifically indicated above   Allergies as of 11/28/2020      Reactions   Other Other (See Comments)   There was an antibiotic that caused hives about 20  years ago.unsure of the name.      Medication List       Accurate as of November 28, 2020  9:59 AM. If you have any questions, ask your nurse or doctor.        STOP taking these medications   apixaban 5 MG Tabs tablet Commonly known as: ELIQUIS Stopped by: Fransisca Kaufmann Salwa Bai, MD   diltiazem 30 MG tablet Commonly known as: Cardizem Stopped by: Worthy Rancher, MD   multivitamin with minerals Tabs tablet Stopped by: Fransisca Kaufmann Azell Bill, MD        Objective:   BP 117/73   Pulse 76   Temp (!) 97 F (36.1 C)   Ht 6' 1"  (1.854 m)   Wt 201 lb (91.2 kg)   SpO2 100%   BMI 26.52 kg/m   Wt Readings from Last 3 Encounters:  11/28/20 201 lb (91.2 kg)  12/25/19 205 lb (93 kg)  12/05/19 208 lb (94.3 kg)    Physical Exam Vitals reviewed.  Constitutional:      General: He is not in acute distress.    Appearance: He is well-developed and well-nourished. He is not diaphoretic.  HENT:     Right Ear: External ear normal.     Left Ear: External ear normal.     Nose: Nose normal.     Mouth/Throat:     Mouth: Oropharynx is  clear and moist.     Pharynx: No oropharyngeal exudate.  Eyes:     General: No scleral icterus.    Extraocular Movements: Extraocular movements intact and EOM normal.     Conjunctiva/sclera: Conjunctivae normal.     Pupils: Pupils are equal, round, and reactive to light.  Neck:     Thyroid: No thyromegaly.  Cardiovascular:     Rate and Rhythm: Normal rate and regular rhythm.     Pulses: Intact distal pulses.     Heart sounds: Normal heart sounds. No murmur heard.   Pulmonary:     Effort: Pulmonary effort is normal. No respiratory distress.     Breath sounds: Normal breath sounds. No wheezing.  Abdominal:     General: Bowel sounds are normal. There is no distension.     Palpations: Abdomen is soft.     Tenderness: There is no abdominal tenderness. There is no guarding or rebound.  Musculoskeletal:        General: No edema. Normal range of motion.      Cervical back: Neck supple.  Lymphadenopathy:     Cervical: No cervical adenopathy.  Skin:    General: Skin is warm and dry.     Findings: No rash.  Neurological:     Mental Status: He is alert and oriented to person, place, and time.     Coordination: Coordination normal.  Psychiatric:        Mood and Affect: Mood and affect normal.        Behavior: Behavior normal.       Assessment & Plan:   Problem List Items Addressed This Visit   None   Visit Diagnoses    Well adult exam    -  Primary   Relevant Orders   CBC with Differential/Platelet   CMP14+EGFR   Lipid panel   PSA, total and free   Hypertriglyceridemia       Relevant Orders   Lipid panel   Prostate cancer screening       Relevant Orders   PSA, total and free      Will check blood work, he will do the Cologuard.  We will see back in 1 year.  Discussed smoking cessation and he is still smoking. Follow up plan: Return in about 1 year (around 11/28/2021), or if symptoms worsen or fail to improve.  Counseling provided for all of the vaccine components No orders of the defined types were placed in this encounter.   Caryl Pina, MD [facility] 11/28/2020, 9:59 AM

## 2020-11-29 LAB — CMP14+EGFR
ALT: 16 IU/L (ref 0–44)
AST: 21 IU/L (ref 0–40)
Albumin/Globulin Ratio: 2 (ref 1.2–2.2)
Albumin: 4.5 g/dL (ref 3.8–4.9)
Alkaline Phosphatase: 55 IU/L (ref 44–121)
BUN/Creatinine Ratio: 10 (ref 10–24)
BUN: 11 mg/dL (ref 8–27)
Bilirubin Total: 0.6 mg/dL (ref 0.0–1.2)
CO2: 23 mmol/L (ref 20–29)
Calcium: 9 mg/dL (ref 8.6–10.2)
Chloride: 103 mmol/L (ref 96–106)
Creatinine, Ser: 1.07 mg/dL (ref 0.76–1.27)
GFR calc Af Amer: 87 mL/min/{1.73_m2} (ref >59)
GFR calc non Af Amer: 75 mL/min/{1.73_m2} (ref >59)
Globulin, Total: 2.3 g/dL (ref 1.5–4.5)
Glucose: 99 mg/dL (ref 65–99)
Potassium: 4.7 mmol/L (ref 3.5–5.2)
Sodium: 138 mmol/L (ref 134–144)
Total Protein: 6.8 g/dL (ref 6.0–8.5)

## 2020-11-29 LAB — LIPID PANEL
Chol/HDL Ratio: 3.1 ratio (ref 0.0–5.0)
Cholesterol, Total: 91 mg/dL — ABNORMAL LOW (ref 100–199)
HDL: 29 mg/dL — ABNORMAL LOW (ref >39)
LDL Chol Calc (NIH): 46 mg/dL (ref 0–99)
Triglycerides: 74 mg/dL (ref 0–149)
VLDL Cholesterol Cal: 16 mg/dL (ref 5–40)

## 2020-11-29 LAB — CBC WITH DIFFERENTIAL/PLATELET
Basophils Absolute: 0 10*3/uL (ref 0.0–0.2)
Basos: 0 %
EOS (ABSOLUTE): 0 10*3/uL (ref 0.0–0.4)
Eos: 0 %
Hematocrit: 43.3 % (ref 37.5–51.0)
Hemoglobin: 14.8 g/dL (ref 13.0–17.7)
Immature Grans (Abs): 0 10*3/uL (ref 0.0–0.1)
Immature Granulocytes: 1 %
Lymphocytes Absolute: 1 10*3/uL (ref 0.7–3.1)
Lymphs: 21 %
MCH: 32.1 pg (ref 26.6–33.0)
MCHC: 34.2 g/dL (ref 31.5–35.7)
MCV: 94 fL (ref 79–97)
Monocytes Absolute: 0.6 10*3/uL (ref 0.1–0.9)
Monocytes: 12 %
Neutrophils Absolute: 3.2 10*3/uL (ref 1.4–7.0)
Neutrophils: 66 %
Platelets: 174 10*3/uL (ref 150–450)
RBC: 4.61 x10E6/uL (ref 4.14–5.80)
RDW: 12 % (ref 11.6–15.4)
WBC: 4.9 10*3/uL (ref 3.4–10.8)

## 2020-11-29 LAB — PSA, TOTAL AND FREE
PSA, Free Pct: 46.3 %
PSA, Free: 0.37 ng/mL
Prostate Specific Ag, Serum: 0.8 ng/mL (ref 0.0–4.0)

## 2020-12-05 ENCOUNTER — Ambulatory Visit: Payer: BC Managed Care – PPO | Admitting: Family Medicine

## 2021-09-30 IMAGING — CR DG CHEST 2V
2 series · 2 of 2 positions shown · non-contrast
Comparison: Chest radiograph dated 01/25/2009

CLINICAL DATA: Chest pain

EXAM:
CHEST - 2 VIEW

[chest pa]
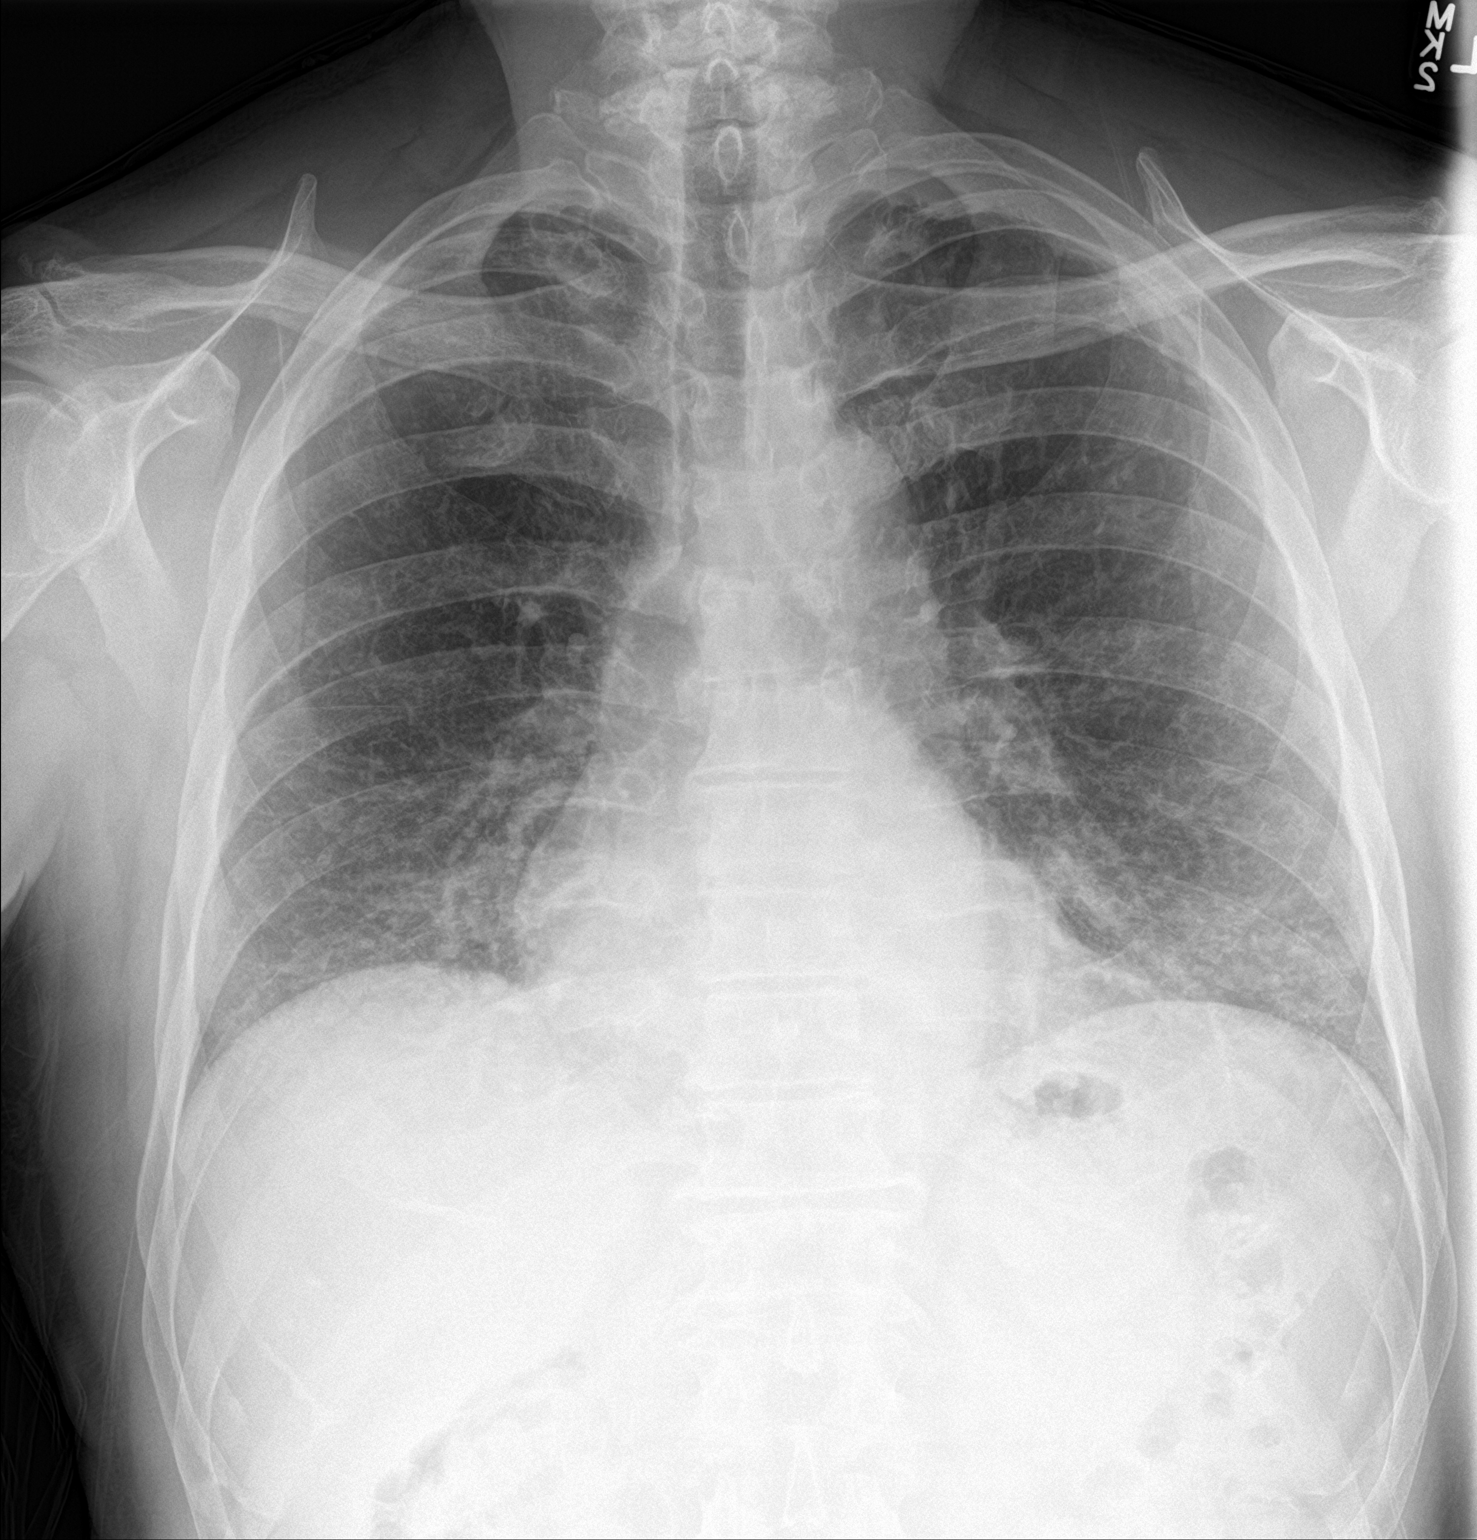

[chest lat]
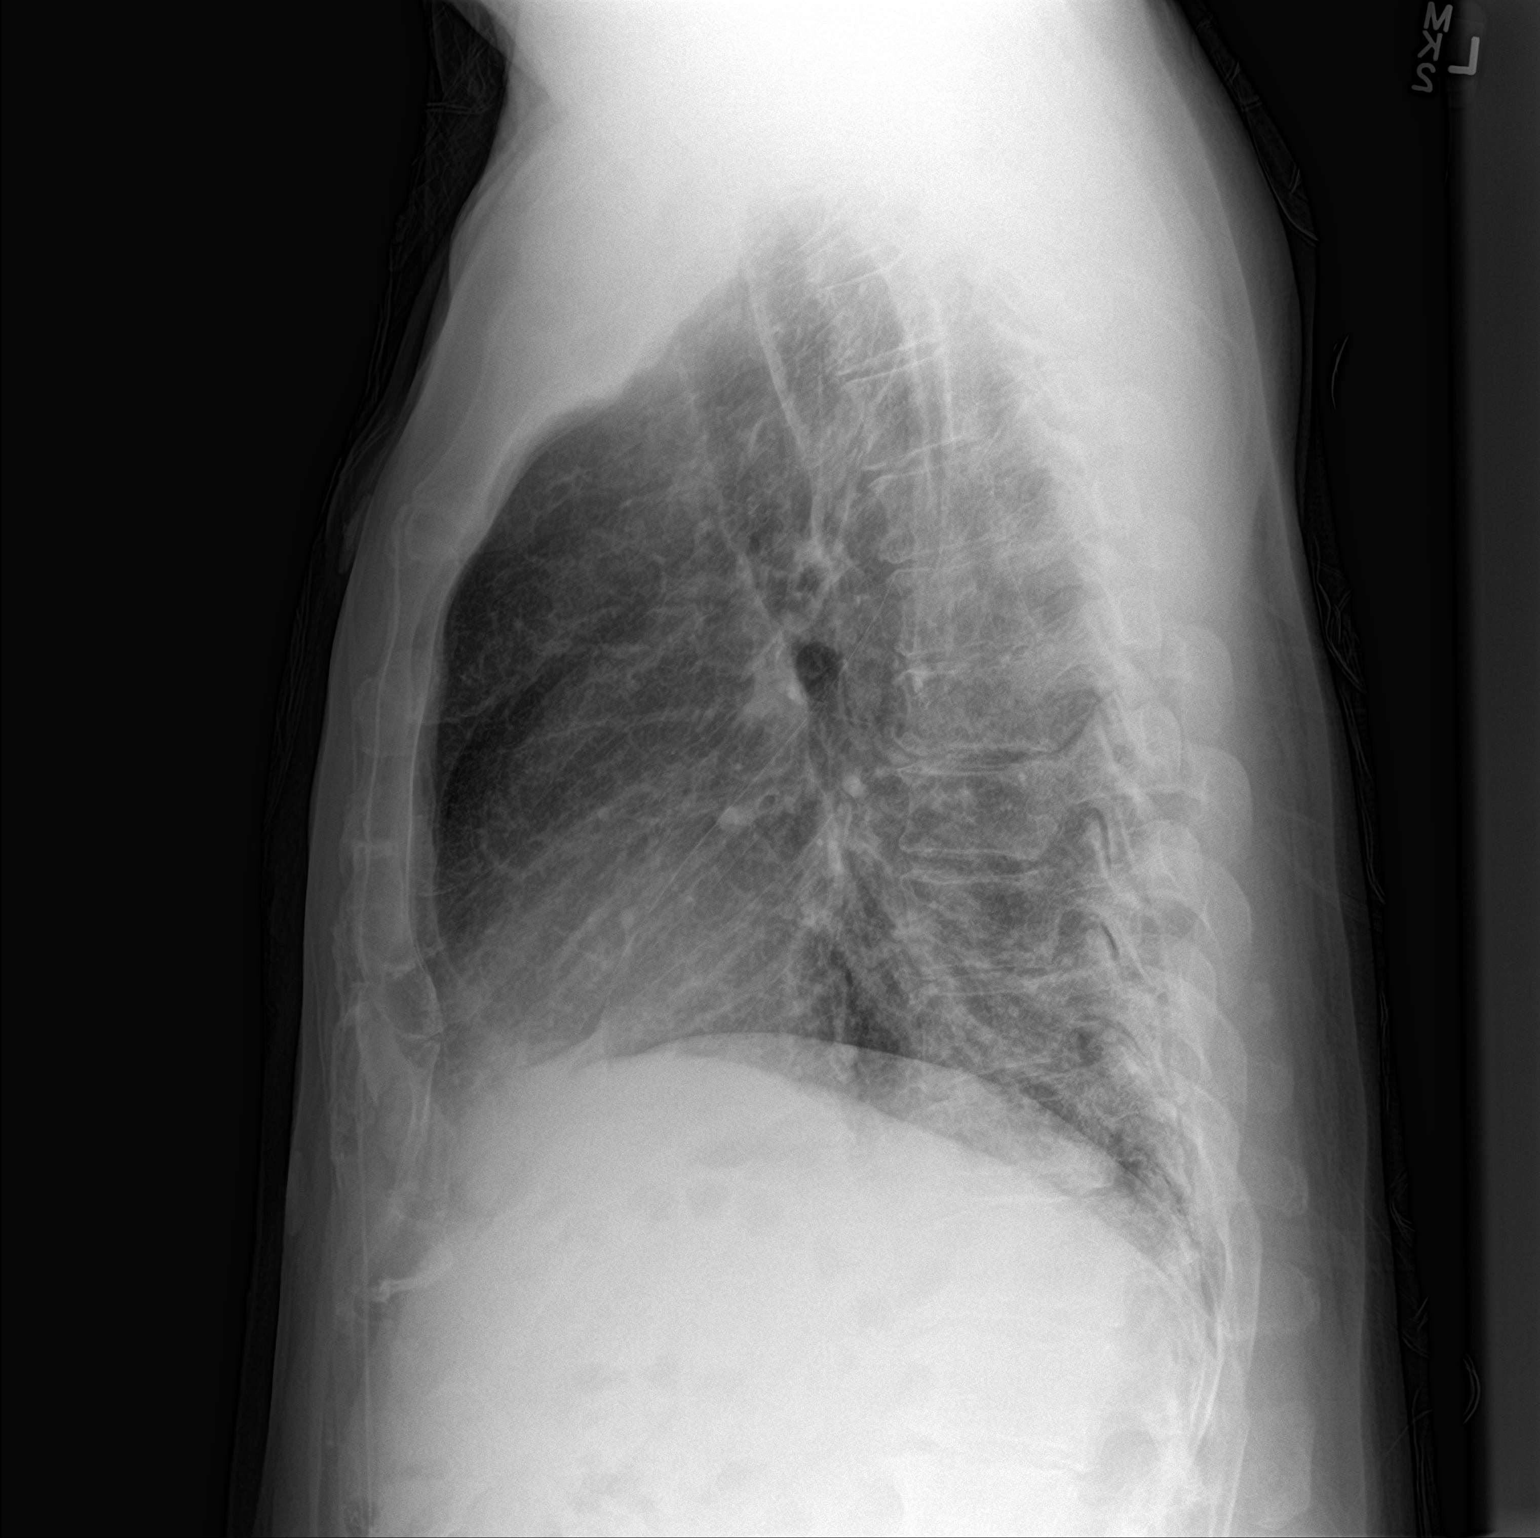

[2 of 2 positions shown; findings below may reference images not displayed]

FINDINGS: The heart size and mediastinal contours are within normal limits.
Mild bibasilar interstitial and airspace opacities are noted. There
is no pleural effusion or pneumothorax. Degenerative changes are
seen in the spine.
IMPRESSION: Mild bibasilar interstitial and airspace opacities may represent
pneumonia.

## 2021-12-15 ENCOUNTER — Encounter: Payer: Self-pay | Admitting: Emergency Medicine

## 2021-12-15 ENCOUNTER — Other Ambulatory Visit: Payer: Self-pay

## 2021-12-15 ENCOUNTER — Ambulatory Visit
Admission: EM | Admit: 2021-12-15 | Discharge: 2021-12-15 | Disposition: A | Discharge status: Home / Self Care | Payer: BC Managed Care – PPO | Attending: Urgent Care | Admitting: Urgent Care

## 2021-12-15 DIAGNOSIS — J209 Acute bronchitis, unspecified: Secondary | ICD-10-CM | POA: Diagnosis not present

## 2021-12-15 DIAGNOSIS — F17200 Nicotine dependence, unspecified, uncomplicated: Secondary | ICD-10-CM | POA: Diagnosis not present

## 2021-12-15 DIAGNOSIS — R062 Wheezing: Secondary | ICD-10-CM | POA: Diagnosis not present

## 2021-12-15 DIAGNOSIS — R051 Acute cough: Secondary | ICD-10-CM | POA: Diagnosis not present

## 2021-12-15 MED ORDER — PROMETHAZINE-DM 6.25-15 MG/5ML PO SYRP: 5.0000 mL | ORAL_SOLUTION | Freq: Every evening | ORAL | 0 refills | Status: DC | PRN

## 2021-12-15 MED ORDER — PREDNISONE 50 MG PO TABS: 50.0000 mg | ORAL_TABLET | Freq: Every day | ORAL | 0 refills | Status: DC

## 2021-12-15 MED ORDER — ALBUTEROL SULFATE HFA 108 (90 BASE) MCG/ACT IN AERS: 1.0000 | INHALATION_SPRAY | Freq: Four times a day (QID) | RESPIRATORY_TRACT | 0 refills | Status: DC | PRN

## 2021-12-15 MED ORDER — BENZONATATE 100 MG PO CAPS
100.0000 mg | ORAL_CAPSULE | Freq: Three times a day (TID) | ORAL | 0 refills | Status: DC | PRN
Start: 2021-12-15 — End: 2021-12-25

## 2021-12-15 MED ORDER — CETIRIZINE HCL 10 MG PO TABS: 10.0000 mg | ORAL_TABLET | Freq: Every day | ORAL | 0 refills | Status: DC

## 2021-12-15 NOTE — ED Provider Notes (Signed)
Henry   MRN: [id number] DOB: 09-19-1961  Subjective:   Samuel Herman is a 61 y.o. male presenting for 3-4 day history of significant malaise, sinus congestion, throat congestion, chest congestion, body aches. Smokes 1/2ppd. Has a history of atrial fibrillation but not COPD, asthma. Did an at home COVID test and was negative. Has had bronchitis in the past and feels like this is the same.   No current facility-administered medications for this encounter. No current outpatient medications on file.   Allergies  Allergen Reactions   Other Other (See Comments)    There was an antibiotic that caused hives about 20 years ago.unsure of the name.    Past Medical History:  Diagnosis Date   Atrial fibrillation ([place])    DCCV 01/2009, not on ASA or antiarrythmic   Tobacco abuse      Past Surgical History:  Procedure Laterality Date   CARDIOVERSION     HERNIA REPAIR     VASECTOMY      Family History  Problem Relation Age of Onset   Heart attack Father 7       deceased   Cancer Mother        breast    Social History   Tobacco Use   Smoking status: Every Day    Packs/day: 0.50    Types: Cigarettes   Smokeless tobacco: Never  Vaping Use   Vaping Use: Never used  Substance Use Topics   Alcohol use: Yes    Alcohol/week: 2.0 standard drinks    Types: 2 Cans of beer per week   Drug use: No    ROS   Objective:   Vitals: BP 127/83    Pulse 78    Temp 98.2 F (36.8 C)    Resp 20    SpO2 99%   Physical Exam Constitutional:      General: He is not in acute distress.    Appearance: Normal appearance. He is well-developed. He is not ill-appearing, toxic-appearing or diaphoretic.  HENT:     Head: Normocephalic and atraumatic.     Right Ear: External ear normal.     Left Ear: External ear normal.     Nose: Nose normal.     Mouth/Throat:     Mouth: Mucous membranes are moist.     Pharynx: No oropharyngeal exudate or posterior oropharyngeal  erythema.  Eyes:     General: No scleral icterus.    Extraocular Movements: Extraocular movements intact.     Pupils: Pupils are equal, round, and reactive to light.  Cardiovascular:     Rate and Rhythm: Normal rate and regular rhythm.     Heart sounds: Normal heart sounds. No murmur heard.   No friction rub. No gallop.  Pulmonary:     Effort: Pulmonary effort is normal. No respiratory distress.     Breath sounds: Normal breath sounds. No stridor. No wheezing, rhonchi or rales.  Neurological:     Mental Status: He is alert and oriented to person, place, and time.  Psychiatric:        Mood and Affect: Mood normal.        Behavior: Behavior normal.        Thought Content: Thought content normal.    Assessment and Plan :   PDMP not reviewed this encounter.  1. Acute bronchitis, unspecified organism   2. Wheezing   3. Acute cough   4. Smoker    Deferred imaging given clear cardiopulmonary exam, hemodynamically stable vital  signs. COVID and flu test pending.  We will otherwise manage for viral bronchitis with supportive care. In light of his smoking, respiratory symptoms offered an oral prednisone course.  Physical exam findings reassuring and vital signs stable for discharge. Advised supportive care, offered symptomatic relief. Counseled patient on potential for adverse effects with medications prescribed/recommended today, ER and return-to-clinic precautions discussed, patient verbalized understanding.      Jaynee Eagles, PA-C 12/15/21 1329

## 2021-12-15 NOTE — ED Triage Notes (Signed)
Pt here with URI sx with low grade fever since Friday. Negative at home COVID tests.

## 2021-12-16 ENCOUNTER — Ambulatory Visit: Payer: Self-pay

## 2021-12-17 LAB — COVID-19, FLU A+B NAA
Influenza A, NAA: DETECTED — AB
Influenza B, NAA: NOT DETECTED
SARS-CoV-2, NAA: NOT DETECTED

## 2021-12-25 ENCOUNTER — Ambulatory Visit (INDEPENDENT_AMBULATORY_CARE_PROVIDER_SITE_OTHER): Payer: BC Managed Care – PPO | Admitting: Family Medicine

## 2021-12-25 ENCOUNTER — Ambulatory Visit (INDEPENDENT_AMBULATORY_CARE_PROVIDER_SITE_OTHER): Payer: BC Managed Care – PPO

## 2021-12-25 ENCOUNTER — Encounter: Payer: Self-pay | Admitting: Family Medicine

## 2021-12-25 VITALS — BP 126/76 | HR 79 | Ht 73.0 in | Wt 207.0 lb

## 2021-12-25 DIAGNOSIS — Z0000 Encounter for general adult medical examination without abnormal findings: Secondary | ICD-10-CM

## 2021-12-25 DIAGNOSIS — Z125 Encounter for screening for malignant neoplasm of prostate: Secondary | ICD-10-CM

## 2021-12-25 DIAGNOSIS — Z0001 Encounter for general adult medical examination with abnormal findings: Secondary | ICD-10-CM | POA: Diagnosis not present

## 2021-12-25 DIAGNOSIS — Z87891 Personal history of nicotine dependence: Secondary | ICD-10-CM | POA: Diagnosis not present

## 2021-12-25 DIAGNOSIS — F17210 Nicotine dependence, cigarettes, uncomplicated: Secondary | ICD-10-CM | POA: Diagnosis not present

## 2021-12-25 NOTE — Progress Notes (Signed)
BP 126/76    Pulse 79    Ht 6' 1"  (1.854 m)    Wt 207 lb (93.9 kg)    SpO2 97%    BMI 27.31 kg/m    Subjective:   Patient ID: Samuel Herman, male    DOB: [birth year], 61 y.o.   MRN: [id number]  HPI: Samuel Herman is a 61 y.o. male presenting on 12/25/2021 for Medical Management of Chronic Issues (CPE)   HPI Adult well exam Patient denies any chest pain, shortness of breath, headaches or vision issues, abdominal complaints, diarrhea, nausea, vomiting, or joint issues.  Patient says he is having some erectile dysfunction and discussed medicines but he wants to hold off on that.  Patient did quit smoking for 4 months but then restarted and would like to think about the Wellbutrin but his wife still smokes through the whole thing so that did not help him and he is not ready to try again right now at this point.  Relevant past medical, surgical, family and social history reviewed and updated as indicated. Interim medical history since our last visit reviewed. Allergies and medications reviewed and updated.  Review of Systems  Constitutional:  Negative for chills and fever.  Eyes:  Negative for visual disturbance.  Respiratory:  Negative for shortness of breath and wheezing.   Cardiovascular:  Negative for chest pain and leg swelling.  Musculoskeletal:  Negative for back pain and gait problem.  Skin:  Negative for rash.  Neurological:  Negative for dizziness, weakness and headaches.  All other systems reviewed and are negative.  Per HPI unless specifically indicated above   Allergies as of 12/25/2021       Reactions   Other Other (See Comments)   There was an antibiotic that caused hives about 20 years ago.unsure of the name.        Medication List        Accurate as of December 25, 2021  3:13 PM. If you have any questions, ask your nurse or doctor.          STOP taking these medications    albuterol 108 (90 Base) MCG/ACT inhaler Commonly known as: VENTOLIN  HFA Stopped by: Fransisca Kaufmann Tuff Clabo, MD   benzonatate 100 MG capsule Commonly known as: TESSALON Stopped by: Worthy Rancher, MD   cetirizine 10 MG tablet Commonly known as: ZyrTEC Allergy Stopped by: Fransisca Kaufmann Airyn Ellzey, MD   predniSONE 50 MG tablet Commonly known as: DELTASONE Stopped by: Worthy Rancher, MD   promethazine-dextromethorphan 6.25-15 MG/5ML syrup Commonly known as: PROMETHAZINE-DM Stopped by: Fransisca Kaufmann Onie Hayashi, MD         Objective:   BP 126/76    Pulse 79    Ht 6' 1"  (1.854 m)    Wt 207 lb (93.9 kg)    SpO2 97%    BMI 27.31 kg/m   Wt Readings from Last 3 Encounters:  12/25/21 207 lb (93.9 kg)  11/28/20 201 lb (91.2 kg)  12/25/19 205 lb (93 kg)    Physical Exam Vitals and nursing note reviewed.  Constitutional:      General: He is not in acute distress.    Appearance: He is well-developed. He is not diaphoretic.  Eyes:     General: No scleral icterus.    Conjunctiva/sclera: Conjunctivae normal.  Neck:     Thyroid: No thyromegaly.  Cardiovascular:     Rate and Rhythm: Normal rate and regular rhythm.     Heart sounds: Normal  heart sounds. No murmur heard. Pulmonary:     Effort: Pulmonary effort is normal. No respiratory distress.     Breath sounds: Normal breath sounds. No wheezing.  Musculoskeletal:        General: Normal range of motion.     Cervical back: Neck supple.  Lymphadenopathy:     Cervical: No cervical adenopathy.  Skin:    General: Skin is warm and dry.     Findings: No rash.  Neurological:     Mental Status: He is alert and oriented to person, place, and time.     Coordination: Coordination normal.  Psychiatric:        Behavior: Behavior normal.      Assessment & Plan:   Problem List Items Addressed This Visit   None Visit Diagnoses     Well adult exam    -  Primary   Relevant Orders   CBC with Differential/Platelet   CMP14+EGFR   Lipid panel   PSA, total and free   Prostate cancer screening       Relevant  Orders   CBC with Differential/Platelet   CMP14+EGFR   Lipid panel   PSA, total and free   Smokes with greater than 40 pack year history       Relevant Orders   DG Chest 2 View       We will do blood work and chest x-ray on the way out.  Discussed Wellbutrin and possibly Viagra versus Cialis and patient wants to wait on those and will talk about them in the future. Follow up plan: Return in about 1 year (around 12/25/2022), or if symptoms worsen or fail to improve, for Physical.  Counseling provided for all of the vaccine components Orders Placed This Encounter  Procedures   DG Chest 2 View   CBC with Differential/Platelet   CMP14+EGFR   Lipid panel   PSA, total and free    Caryl Pina, MD [facility] 12/25/2021, 3:13 PM

## 2021-12-26 LAB — CBC WITH DIFFERENTIAL/PLATELET
Basophils Absolute: 0.1 10*3/uL (ref 0.0–0.2)
Basos: 1 %
EOS (ABSOLUTE): 0.2 10*3/uL (ref 0.0–0.4)
Eos: 2 %
Hematocrit: 38.3 % (ref 37.5–51.0)
Hemoglobin: 13 g/dL (ref 13.0–17.7)
Immature Grans (Abs): 0.4 10*3/uL — ABNORMAL HIGH (ref 0.0–0.1)
Immature Granulocytes: 4 %
Lymphocytes Absolute: 1.8 10*3/uL (ref 0.7–3.1)
Lymphs: 18 %
MCH: 32.1 pg (ref 26.6–33.0)
MCHC: 33.9 g/dL (ref 31.5–35.7)
MCV: 95 fL (ref 79–97)
Monocytes Absolute: 0.7 10*3/uL (ref 0.1–0.9)
Monocytes: 7 %
Neutrophils Absolute: 7 10*3/uL (ref 1.4–7.0)
Neutrophils: 68 %
Platelets: 326 10*3/uL (ref 150–450)
RBC: 4.05 x10E6/uL — ABNORMAL LOW (ref 4.14–5.80)
RDW: 12 % (ref 11.6–15.4)
WBC: 10.1 10*3/uL (ref 3.4–10.8)

## 2021-12-26 LAB — LIPID PANEL
Chol/HDL Ratio: 2.7 ratio (ref 0.0–5.0)
Cholesterol, Total: 85 mg/dL — ABNORMAL LOW (ref 100–199)
HDL: 32 mg/dL — ABNORMAL LOW (ref >39)
LDL Chol Calc (NIH): 36 mg/dL (ref 0–99)
Triglycerides: 85 mg/dL (ref 0–149)
VLDL Cholesterol Cal: 17 mg/dL (ref 5–40)

## 2021-12-26 LAB — CMP14+EGFR
ALT: 21 IU/L (ref 0–44)
AST: 21 IU/L (ref 0–40)
Albumin/Globulin Ratio: 1.5 (ref 1.2–2.2)
Albumin: 4.1 g/dL (ref 3.8–4.8)
Alkaline Phosphatase: 49 IU/L (ref 44–121)
BUN/Creatinine Ratio: 19 (ref 10–24)
BUN: 19 mg/dL (ref 8–27)
Bilirubin Total: 0.4 mg/dL (ref 0.0–1.2)
CO2: 23 mmol/L (ref 20–29)
Calcium: 9.3 mg/dL (ref 8.6–10.2)
Chloride: 106 mmol/L (ref 96–106)
Creatinine, Ser: 0.98 mg/dL (ref 0.76–1.27)
Globulin, Total: 2.7 g/dL (ref 1.5–4.5)
Glucose: 103 mg/dL — ABNORMAL HIGH (ref 70–99)
Potassium: 4.4 mmol/L (ref 3.5–5.2)
Sodium: 142 mmol/L (ref 134–144)
Total Protein: 6.8 g/dL (ref 6.0–8.5)
eGFR: 88 mL/min/{1.73_m2} (ref >59)

## 2021-12-26 LAB — PSA, TOTAL AND FREE
PSA, Free Pct: 38 %
PSA, Free: 0.38 ng/mL
Prostate Specific Ag, Serum: 1 ng/mL (ref 0.0–4.0)

## 2022-02-24 ENCOUNTER — Other Ambulatory Visit: Payer: Self-pay

## 2022-02-24 ENCOUNTER — Encounter: Payer: Self-pay | Admitting: Family Medicine

## 2022-02-24 ENCOUNTER — Emergency Department (HOSPITAL_COMMUNITY)
Admission: EM | Admit: 2022-02-24 | Discharge: 2022-02-24 | Disposition: A | Discharge status: Home / Self Care | Payer: BC Managed Care – PPO | Attending: Emergency Medicine | Admitting: Emergency Medicine

## 2022-02-24 ENCOUNTER — Emergency Department (HOSPITAL_COMMUNITY): Payer: BC Managed Care – PPO

## 2022-02-24 ENCOUNTER — Encounter (HOSPITAL_COMMUNITY): Payer: Self-pay

## 2022-02-24 ENCOUNTER — Ambulatory Visit (INDEPENDENT_AMBULATORY_CARE_PROVIDER_SITE_OTHER): Payer: BC Managed Care – PPO | Admitting: Family Medicine

## 2022-02-24 ENCOUNTER — Telehealth (HOSPITAL_COMMUNITY): Payer: Self-pay | Admitting: Emergency Medicine

## 2022-02-24 VITALS — BP 117/69 | HR 97 | Temp 97.4°F | Ht 73.0 in | Wt 208.2 lb

## 2022-02-24 DIAGNOSIS — Z8679 Personal history of other diseases of the circulatory system: Secondary | ICD-10-CM

## 2022-02-24 DIAGNOSIS — I4891 Unspecified atrial fibrillation: Secondary | ICD-10-CM | POA: Insufficient documentation

## 2022-02-24 DIAGNOSIS — R000 Tachycardia, unspecified: Secondary | ICD-10-CM | POA: Diagnosis not present

## 2022-02-24 DIAGNOSIS — R5383 Other fatigue: Secondary | ICD-10-CM | POA: Diagnosis not present

## 2022-02-24 LAB — CBC
HCT: 43.5 % (ref 39.0–52.0)
Hemoglobin: 14.5 g/dL (ref 13.0–17.0)
MCH: 32.6 pg (ref 26.0–34.0)
MCHC: 33.3 g/dL (ref 30.0–36.0)
MCV: 97.8 fL (ref 80.0–100.0)
Platelets: 207 10*3/uL (ref 150–400)
RBC: 4.45 MIL/uL (ref 4.22–5.81)
RDW: 12.9 % (ref 11.5–15.5)
WBC: 10.1 10*3/uL (ref 4.0–10.5)
nRBC: 0 % (ref 0.0–0.2)

## 2022-02-24 LAB — BASIC METABOLIC PANEL
Anion gap: 8 (ref 5–15)
BUN: 17 mg/dL (ref 8–23)
CO2: 21 mmol/L — ABNORMAL LOW (ref 22–32)
Calcium: 8.9 mg/dL (ref 8.9–10.3)
Chloride: 110 mmol/L (ref 98–111)
Creatinine, Ser: 1.03 mg/dL (ref 0.61–1.24)
GFR, Estimated: >60 mL/min (ref >60)
Glucose, Bld: 99 mg/dL (ref 70–99)
Potassium: 4.6 mmol/L (ref 3.5–5.1)
Sodium: 139 mmol/L (ref 135–145)

## 2022-02-24 LAB — MAGNESIUM: Magnesium: 1.9 mg/dL (ref 1.7–2.4)

## 2022-02-24 MED ORDER — APIXABAN 5 MG PO TABS: 5.0000 mg | ORAL_TABLET | Freq: Two times a day (BID) | ORAL | 0 refills | Status: DC

## 2022-02-24 MED ORDER — APIXABAN 5 MG PO TABS: 5.0000 mg | ORAL_TABLET | Freq: Two times a day (BID) | ORAL | Status: DC

## 2022-02-24 MED ORDER — METOPROLOL TARTRATE 25 MG PO TABS: 50.0000 mg | ORAL_TABLET | Freq: Two times a day (BID) | ORAL | 0 refills | Status: DC

## 2022-02-24 MED ORDER — ETOMIDATE 2 MG/ML IV SOLN
8.0000 mg | Freq: Once | INTRAVENOUS | Status: AC
  Administered 2022-02-24: 6 mg via INTRAVENOUS
  Filled 2022-02-24: qty 10

## 2022-02-24 MED ORDER — DILTIAZEM HCL 25 MG/5ML IV SOLN
20.0000 mg | Freq: Once | INTRAVENOUS | Status: AC
  Administered 2022-02-24: 20 mg via INTRAVENOUS
  Filled 2022-02-24: qty 5

## 2022-02-24 MED ORDER — ETOMIDATE 2 MG/ML IV SOLN
INTRAVENOUS | Status: AC | PRN
  Administered 2022-02-24: 5 mg via INTRAVENOUS
  Administered 2022-02-24: 2 mg via INTRAVENOUS

## 2022-02-24 MED ORDER — RIVAROXABAN 20 MG PO TABS: 20.0000 mg | ORAL_TABLET | Freq: Every day | ORAL | 0 refills | Status: DC

## 2022-02-24 MED ORDER — DILTIAZEM HCL 30 MG PO TABS
30.0000 mg | ORAL_TABLET | Freq: Once | ORAL | Status: AC
  Administered 2022-02-24: 30 mg via ORAL
  Filled 2022-02-24: qty 1

## 2022-02-24 NOTE — Progress Notes (Signed)
? ?Subjective:  ?Patient ID: Samuel Herman, male    DOB: 06/23/1961  Age: 61 y.o. MRN: [id number] ? ?CC: Fatigue ? ? ?HPI ?Samuel Herman presents for feeling of being rundown for the last couple days.  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  He rested the next day which was 2 days ago.  Took several naps through the day.  Yesterday he did not feel much better.  Today he still feels rundown so he came in.  He has a history of atrial fibrillation 2 years ago seen by Dr. Graciela Husbands.  He had to have cardioversion at that time.  He said the medicine that was tried left him feeling rundown so he discontinued it.  He had been placed on Cardizem and apixaban.  He is currently not taking any medications.  He sees so many old people taking so many medicines that he has been hesitant to try anything.  Today he is feeling palpitations but no chest pain.  He is not short of breath.  He does not have edema. ? ?Depression screen [facility] 2/9 12/25/2021 11/28/2020 12/05/2019  ?Decreased Interest 0 0 0  ?Down, Depressed, Hopeless 0 0 0  ?PHQ - 2 Score 0 0 0  ?Altered sleeping 0 - -  ?Tired, decreased energy 0 - -  ?Change in appetite 0 - -  ?Feeling bad or failure about yourself  0 - -  ?Trouble concentrating 0 - -  ?Moving slowly or fidgety/restless 0 - -  ?Suicidal thoughts 0 - -  ?PHQ-9 Score 0 - -  ? ? ?History ?[place] has a past medical history of Atrial fibrillation (HCC) and Tobacco abuse.  ? ?He has a past surgical history that includes Cardioversion; Hernia repair; and Vasectomy.  ? ?His family history includes Cancer in his mother; Heart attack (age of onset: 47) in his father.He reports that he has been smoking cigarettes. He has been smoking an average of .5 packs per day. He has never used smokeless tobacco. He reports current alcohol use of about 2.0 standard drinks per week. He reports that he does not use drugs. ? ? ? ?ROS ?Review of Systems  ?Constitutional:  Negative for fever.  ?Respiratory:  Negative for  shortness of breath and wheezing.   ?Cardiovascular:  Positive for palpitations. Negative for chest pain.  ?Musculoskeletal:  Negative for arthralgias.  ?Skin:  Negative for rash.  ? ?Objective:  ?BP 117/69   Pulse 97   Temp (!) 97.4 ?F (36.3 ?C)   Ht 6\' 1"  (1.854 m)   Wt 208 lb 3.2 oz (94.4 kg)   SpO2 100%   BMI 27.47 kg/m?  ? ?BP Readings from Last 3 Encounters:  ?02/24/22 117/69  ?12/25/21 126/76  ?12/15/21 127/83  ? ? ?Wt Readings from Last 3 Encounters:  ?02/24/22 208 lb 3.2 oz (94.4 kg)  ?12/25/21 207 lb (93.9 kg)  ?11/28/20 201 lb (91.2 kg)  ? ? ? ?Physical Exam ?Vitals reviewed.  ?Constitutional:   ?   Appearance: He is well-developed.  ?HENT:  ?   Head: Normocephalic and atraumatic.  ?   Right Ear: External ear normal.  ?   Left Ear: External ear normal.  ?   Mouth/Throat:  ?   Pharynx: No oropharyngeal exudate or posterior oropharyngeal erythema.  ?Eyes:  ?   Pupils: Pupils are equal, round, and reactive to light.  ?Cardiovascular:  ?   Rate and Rhythm: Tachycardia present. Rhythm irregularly irregular. FrequentExtrasystoles are present. ?  Chest Wall: PMI is not displaced. No thrill.  ?   Pulses:     ?     Carotid pulses are 2+ on the right side and 2+ on the left side. ?     Radial pulses are 2+ on the right side and 2+ on the left side.  ?     Femoral pulses are 2+ on the right side and 2+ on the left side. ?     Popliteal pulses are 2+ on the right side and 2+ on the left side.  ?     Dorsalis pedis pulses are 2+ on the right side and 2+ on the left side.  ?     Posterior tibial pulses are 2+ on the right side and 2+ on the left side.  ?   Heart sounds: No murmur heard. ?Pulmonary:  ?   Effort: No respiratory distress.  ?   Breath sounds: Normal breath sounds.  ?Musculoskeletal:  ?   Cervical back: Normal range of motion and neck supple.  ?   Right lower leg: No edema.  ?   Left lower leg: No edema.  ?Neurological:  ?   Mental Status: He is alert and oriented to person, place, and time.   ? ? ?EKG shows A. Fib & flutter at 140. ? ? ? ?Assessment & Plan:  ? ?Breydan was seen today for fatigue. ? ?Diagnoses and all orders for this visit: ? ?Atrial fibrillation with RVR (HCC) ? ?History of atrial fibrillation ?-     EKG 12-Lead ? ? ?Pt. Transferred for definitive care via ambulance to E.D. ? ? ? ?Samuel Herman does not currently have medications on file. ? ?Allergies as of 02/24/2022   ? ?   Reactions  ? Other Other (See Comments)  ? There was an antibiotic that caused hives about 20 years ago.unsure of the name.  ? ?  ? ?  ?Medication List  ?  ?as of February 24, 2022 10:42 AM ?  ?You have not been prescribed any medications. ?  ? ? ? ?Follow-up: Return in about 2 weeks (around 03/10/2022) for with Dr. Louanne Skye. Cardiology with Dr. Graciela Husbands as well. ? ?Mechele Claude, M.D. ?

## 2022-02-24 NOTE — Sedation Documentation (Addendum)
Pt having cold chills and shaking so badly unable to obtain BP at this time. Will continue to try and obtain. RN at bedside.  ?

## 2022-02-24 NOTE — ED Triage Notes (Addendum)
Patient arrived via EMS with complaints of generalized weakness and tachycardia. EMS gave cardizem for tachycardia in 150's. CBG 134.  ?

## 2022-02-24 NOTE — Sedation Documentation (Signed)
NSR on cardiac monitor after cardioversion.  ?

## 2022-02-24 NOTE — Telephone Encounter (Signed)
Attempted to start patient on Eliquis and he was given Eliquis discount coupon prior to ER discharge.  patient had previously been prescribed Eliquis, so coupon was invalid.  Will start him on Xarelto instead.  Prescription sent to patient's pharmacy of choice ?

## 2022-02-24 NOTE — Discharge Instructions (Signed)
You have been prescribed medications today that will help keep your heart rate in a normal range.  You have also been started on a blood thinner today called Eliquis.  Please take these medications as directed.  Please follow-up with Dr. Graciela Husbands for recheck.  Return to the emergency department for any new or worsening symptoms. ?

## 2022-02-24 NOTE — ED Notes (Signed)
Patient ambulated  to restroom without assistance.

## 2022-02-24 NOTE — Sedation Documentation (Signed)
Synchronized cardioversion at 200 joules by Dr. Rhunette Croft.  ?
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
## 2022-02-24 NOTE — ED Provider Notes (Signed)
?[place] ?Provider Note ? ? ?CSN: [id number] ?Arrival date & time: 02/24/22  1106 ? ?  ? ?History ? ?Chief Complaint  ?Patient presents with  ? Weakness  ? ? ?Samuel Herman is a 61 y.o. male. ? ? ?Weakness ?Associated symptoms: no abdominal pain, no chest pain, no dizziness, no fever, no headaches, no nausea, no shortness of breath and no vomiting   ? ?  ? ? ?Samuel Herman is a 61 y.o. male with past medical history of paroxysmal atrial fibrillation not currently anticoagulated or on any rate controlling medication.  He presents to the Emergency Department complaining of generalized weakness and elevated heart rate.  Symptoms began 3 days ago.  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  He states this is similar to previous episode of atrial fibrillation in which he required cardioversion.  He was placed on medications at that time but did not like the way the medications made him feel and discontinued them.  He was seen at PCPs office this morning and noted to have an irregularly irregular rhythm and tachycardia and sent via EMS for further evaluation. ?He was given Cardizem in route by EMS.  Rates in the 80s on arrival.  He denies any shortness of breath, palpitations or chest pain.  No dizziness syncope or diaphoresis. ? ? ?Home Medications ?Prior to Admission medications   ?Not on File  ?   ? ?Allergies    ?Other   ? ?Review of Systems   ?Review of Systems  ?Constitutional:  Negative for chills and fever.  ?Respiratory:  Negative for shortness of breath.   ?Cardiovascular:  Negative for chest pain.  ?Gastrointestinal:  Negative for abdominal pain, nausea and vomiting.  ?Neurological:  Positive for weakness. Negative for dizziness, syncope, numbness and headaches.  ? ?Physical Exam ?Updated Vital Signs ?BP 103/78   Pulse (!) 129   Temp 98.6 ?F (37 ?C) (Oral)   Resp (!) 22   Ht 6\' 1"  (1.854 m)   Wt 94 kg   SpO2 95%    BMI 27.34 kg/m?  ?Physical Exam ?Vitals and nursing note reviewed.  ?Constitutional:   ?   General: He is not in acute distress. ?   Appearance: Normal appearance. He is not ill-appearing.  ?HENT:  ?   Mouth/Throat:  ?   Mouth: Mucous membranes are moist.  ?Eyes:  ?   Conjunctiva/sclera: Conjunctivae normal.  ?   Pupils: Pupils are equal, round, and reactive to light.  ?Cardiovascular:  ?   Rate and Rhythm: Tachycardia present. Rhythm irregular.  ?   Pulses: Normal pulses.  ?Pulmonary:  ?   Effort: Pulmonary effort is normal. No respiratory distress.  ?   Breath sounds: Normal breath sounds.  ?Chest:  ?   Chest wall: No tenderness.  ?Abdominal:  ?   Palpations: Abdomen is soft.  ?   Tenderness: There is no abdominal tenderness.  ?Musculoskeletal:     ?   General: Normal range of motion.  ?   Right lower leg: No edema.  ?   Left lower leg: No edema.  ?Skin: ?   General: Skin is warm.  ?   Capillary Refill: Capillary refill takes less than 2 seconds.  ?Neurological:  ?   General: No focal deficit present.  ?   Mental Status: He is alert.  ?   Sensory: No sensory deficit.  ?   Motor:  No weakness.  ? ? ?ED Results / Procedures / Treatments   ?Labs ?(all labs ordered are listed, but only abnormal results are displayed) ?Labs Reviewed  ?BASIC METABOLIC PANEL - Abnormal; Notable for the following components:  ?    Result Value  ? CO2 21 (*)   ? All other components within normal limits  ?CBC  ?MAGNESIUM  ? ? ?EKG ?EKG Interpretation ? ?Date/Time:  Tuesday February 24 2022 11:18:04 EDT ?Ventricular Rate:  106 ?PR Interval:    ?QRS Duration: 94 ?QT Interval:  352 ?QTC Calculation: 445 ?R Axis:   0 ?Text Interpretation: Atrial fibrillation afib is new No acute changes No significant change since last tracing Confirmed by Varney Biles [phone]) on 02/24/2022 2:16:06 PM ? ?Radiology ?DG Chest Port 1 View ? ?Result Date: 02/24/2022 ?CLINICAL DATA:  Atrial fibrillation EXAM: PORTABLE CHEST 1 VIEW COMPARISON:  12/25/2021 FINDINGS:  Numerous leads and wires project over the chest. Midline trachea. Normal heart size. Atherosclerosis in the transverse aorta. No pleural effusion or pneumothorax. Moderate pulmonary interstitial thickening IMPRESSION: No acute cardiopulmonary disease. Peribronchial thickening which may relate to chronic bronchitis or smoking. Electronically Signed   By: Abigail Miyamoto M.D.   On: 02/24/2022 11:57   ? ?Procedures ?Procedures  ? ? ?Medications Ordered in ED ?Medications - No data to display ? ?ED Course/ Medical Decision Making/ A&P ?  ?                        ?Medical Decision Making ?Patient here for complaint of generalized weakness for 3 days.  Was seen by PCP earlier today and found to have an irregularly irregular heart rhythm with tachycardia.  Sent to the ER for further evaluation for atrial fibrillation. ? ?Patient was brought in by EMS, given Cardizem in route.  Heart rate on arrival in the 80s patient denies any chest pain, syncope or shortness of breath.  During ER stay patient continued to remain in A-fib heart rate increased to 130s to 150s, IV Cardizem repeated along with oral Cardizem.  Patient states that when he had similar episode 2 years ago that he required cardioversion. ? ?Amount and/or Complexity of Data Reviewed ?Independent Historian: spouse ?External Data Reviewed: notes. ?   Details: Patient was prior echocardiogram from 2020 showed EF of 55 to 60%. ?Labs: ordered. ?   Details: Labs interpreted by me, no leukocytosis.  Hemoglobin unremarkable.  Chemistries also unremarkable.  Magnesium level 1.9. ?Radiology: ordered. ?   Details: Chest x-ray today without acute findings ?ECG/medicine tests: ordered. ?   Details: EKG today shows atrial fibrillation with rate of 106 ?Discussion of management or test interpretation with external provider(s): Discussed findings with cardiology, Dr. Johney Frame who recommends anticoagulation with Eliquis and metoprolol 25 mg twice daily.  Patient will be referred to  A-fib clinic as well.  Pt prefers follow-up with his cardiologist in [place], Dr. Caryl Comes ? ?Risk ?Prescription drug management. ? ? ?Patient required cardioversion here, after failed attempt of rate control with Cardizem.  See Dr. Andree Elk note for cardioversion. ? ?After procedure, patient has been resting comfortably.  No longer tachycardic.  Sinus rhythm on cardiac monitoring with heart rate of 86. ? ?He has been given oral dose of Eliquis here and prescription.  Prescription also written for metoprolol 25 mg twice daily and he will be referred to A-fib clinic.  He is agreeable to close patient follow-up with Dr. Caryl Comes with cardiology. ? ?CHA2DS2-VASc score is 0 ? ? ? ? ? ? ? ?  Final Clinical Impression(s) / ED Diagnoses ?Final diagnoses:  ?Atrial fibrillation with RVR ([place])  ? ? ?Rx / DC Orders ?ED Discharge Orders   ? ? None  ? ?  ? ? ?  ?Kem Parkinson, PA-C ?02/24/22 1747 ? ?  ?Varney Biles, MD ?02/27/22 2309 ? ?

## 2022-02-25 ENCOUNTER — Telehealth (HOSPITAL_COMMUNITY): Payer: Self-pay | Admitting: Physician Assistant

## 2022-02-25 ENCOUNTER — Telehealth: Payer: Self-pay | Admitting: Family Medicine

## 2022-02-25 DIAGNOSIS — I4891 Unspecified atrial fibrillation: Secondary | ICD-10-CM

## 2022-02-25 NOTE — Telephone Encounter (Signed)
Called patient again, still no answer and no VM to leave a message. ?

## 2022-02-25 NOTE — Telephone Encounter (Signed)
Pts wife called stating that we sent pt to the ED yesterday and pt was prescribed a blood thinner. Wife says that insurance wont cover the blood thinner without PA. ? ?Wants to know if we can take care of this today so that she can pick medicine up for pt.  ?

## 2022-02-25 NOTE — Telephone Encounter (Signed)
(  KeyIF:[id number]) ? ?Drug ?Xarelto 20MG  tablets ? ?This request has been approved using information available on the patient's profile. [id number];Review Type:Prior Auth;Coverage Start Date:01/26/2022;Coverage End Date:02/25/2023; ? ?[facility] aware  ?

## 2022-02-25 NOTE — Telephone Encounter (Signed)
Per staff message from Dr. Shari Prows, pt was seen in ED and had cardioversion on 02/24/22 and needs f/u with [facility].  Called patient and does not have VM so was unable to leave a message.  Will call back. ?

## 2022-02-25 NOTE — Telephone Encounter (Signed)
Pt has new onset Afib per wife. Pt was not admitted. Samples left up front of 20mg  Xarelto. 3 weeks worth.Appt made for 3/22 at 3:10. Made 51m in appt due to new diagnosis. ?

## 2022-03-02 NOTE — Telephone Encounter (Signed)
Patient has f/u appt with PCP for atib on 03/04/22. ?

## 2022-03-04 ENCOUNTER — Ambulatory Visit (INDEPENDENT_AMBULATORY_CARE_PROVIDER_SITE_OTHER): Payer: BC Managed Care – PPO | Admitting: Family Medicine

## 2022-03-04 ENCOUNTER — Encounter: Payer: Self-pay | Admitting: Family Medicine

## 2022-03-04 VITALS — BP 124/78 | HR 86 | Ht 73.0 in | Wt 209.0 lb

## 2022-03-04 DIAGNOSIS — I480 Paroxysmal atrial fibrillation: Secondary | ICD-10-CM

## 2022-03-04 DIAGNOSIS — I4891 Unspecified atrial fibrillation: Secondary | ICD-10-CM | POA: Diagnosis not present

## 2022-03-04 DIAGNOSIS — Z716 Tobacco abuse counseling: Secondary | ICD-10-CM

## 2022-03-04 MED ORDER — CHANTIX STARTING MONTH PAK 0.5 MG X 11 & 1 MG X 42 PO TBPK: ORAL_TABLET | ORAL | 0 refills | Status: AC

## 2022-03-04 MED ORDER — RIVAROXABAN 20 MG PO TABS: 20.0000 mg | ORAL_TABLET | Freq: Every day | ORAL | 0 refills | Status: DC

## 2022-03-04 MED ORDER — VARENICLINE TARTRATE 1 MG PO TABS: 1.0000 mg | ORAL_TABLET | Freq: Two times a day (BID) | ORAL | 1 refills | Status: DC

## 2022-03-04 NOTE — Patient Instructions (Signed)
Use metoprolol as needed and follow-up with cardiology ?

## 2022-03-04 NOTE — Progress Notes (Signed)
? ?BP 124/78   Pulse 86   Ht 6' 1"  (1.854 m)   Wt 209 lb (94.8 kg)   SpO2 98%   BMI 27.57 kg/m?   ? ?Subjective:  ? ?Patient ID: Samuel Herman, male    DOB: November 13, 1961, 61 y.o.   MRN: [id number] ? ?HPI: ?Samuel Herman is a 61 y.o. male presenting on 03/04/2022 for New diagnosis of A fib ? ? ?HPI ?Paroxysmal A-fib  ?Patient is coming today for paroxysmal A-fib/hospital follow-up.  He was sent to the hospital with A-fib with RVR.  He has been diagnosed with A-fib in the past but had not had for a long time.  He was seen in the hospital and RVR started on metoprolol and Xarelto.  He has not been taking the metoprolol because it is leaving him too tired and he feels like his heart rates been good without it.  His heart rate is regular today and is 86.  He did have cardioversion in the hospital as well and this was to have a cardiology follow-up.  He he does not know if he has an appointment yet. ? ?Relevant past medical, surgical, family and social history reviewed and updated as indicated. Interim medical history since our last visit reviewed. ?Allergies and medications reviewed and updated. ? ?Review of Systems  ?Constitutional:  Negative for chills and fever.  ?Eyes:  Negative for visual disturbance.  ?Respiratory:  Negative for shortness of breath and wheezing.   ?Cardiovascular:  Negative for chest pain, palpitations and leg swelling.  ?Gastrointestinal:  Negative for blood in stool.  ?Genitourinary:  Negative for hematuria.  ?Musculoskeletal:  Negative for back pain and gait problem.  ?Skin:  Negative for rash.  ?Neurological:  Negative for dizziness, weakness and light-headedness.  ?All other systems reviewed and are negative. ? ?Per HPI unless specifically indicated above ? ? ?Allergies as of 03/04/2022   ? ?   Reactions  ? Other Other (See Comments)  ? There was an antibiotic that caused hives about 20 years ago.unsure of the name.  ? ?  ? ?  ?Medication List  ?  ? ?  ? Accurate as of March 04, 2022   4:43 PM. If you have any questions, ask your nurse or doctor.  ?  ?  ? ?  ? ?STOP taking these medications   ? ?metoprolol tartrate 25 MG tablet ?Commonly known as: LOPRESSOR ?Stopped by: Worthy Rancher, MD ?  ? ?  ? ?TAKE these medications   ? ?Chantix Starting Month Pak 0.5 MG X 11 & 1 MG X 42 Tbpk ?Generic drug: Varenicline Tartrate (Starter) ?Take 0.5 mg by mouth daily for 3 days, THEN 0.5 mg 2 (two) times daily for 4 days, THEN 1 mg 2 (two) times daily for 23 days. ?Start taking on: March 04, 2022 ?Started by: Worthy Rancher, MD ?  ?varenicline 1 MG tablet ?Commonly known as: Chantix Continuing Month Pak ?Take 1 tablet (1 mg total) by mouth 2 (two) times daily. ?Started by: Worthy Rancher, MD ?  ?rivaroxaban 20 MG Tabs tablet ?Commonly known as: XARELTO ?Take 1 tablet (20 mg total) by mouth daily with supper. ?  ? ?  ? ? ? ?Objective:  ? ?BP 124/78   Pulse 86   Ht 6' 1"  (1.854 m)   Wt 209 lb (94.8 kg)   SpO2 98%   BMI 27.57 kg/m?   ?Wt Readings from Last 3 Encounters:  ?03/04/22 209 lb (94.8 kg)  ?  02/24/22 207 lb 3.7 oz (94 kg)  ?02/24/22 208 lb 3.2 oz (94.4 kg)  ?  ?Physical Exam ?Vitals and nursing note reviewed.  ?Constitutional:   ?   General: He is not in acute distress. ?   Appearance: He is well-developed. He is not diaphoretic.  ?Eyes:  ?   General: No scleral icterus. ?   Conjunctiva/sclera: Conjunctivae normal.  ?Neck:  ?   Thyroid: No thyromegaly.  ?Cardiovascular:  ?   Rate and Rhythm: Normal rate and regular rhythm.  ?   Heart sounds: Normal heart sounds. No murmur heard. ?Pulmonary:  ?   Effort: Pulmonary effort is normal. No respiratory distress.  ?   Breath sounds: Normal breath sounds. No wheezing.  ?Musculoskeletal:     ?   General: No swelling. Normal range of motion.  ?   Cervical back: Neck supple.  ?Lymphadenopathy:  ?   Cervical: No cervical adenopathy.  ?Skin: ?   General: Skin is warm and dry.  ?   Findings: No rash.  ?Neurological:  ?   Mental Status: He is alert and  oriented to person, place, and time.  ?   Coordination: Coordination normal.  ?Psychiatric:     ?   Behavior: Behavior normal.  ? ? ? ? ?Assessment & Plan:  ? ?Problem List Items Addressed This Visit   ? ?  ? Cardiovascular and Mediastinum  ? Atrial fibrillation with RVR ([place])  ? Relevant Medications  ? rivaroxaban (XARELTO) 20 MG TABS tablet  ? Paroxysmal atrial fibrillation (HCC) - Primary  ? Relevant Medications  ? rivaroxaban (XARELTO) 20 MG TABS tablet  ? Other Relevant Orders  ? CBC with Differential/Platelet  ? CMP14+EGFR  ? Ambulatory referral to Cardiology  ? ?Other Visit Diagnoses   ? ? Encounter for smoking cessation counseling      ? Relevant Medications  ? Varenicline Tartrate, Starter, (CHANTIX STARTING MONTH PAK) 0.5 MG X 11 & 1 MG X 42 TBPK  ? varenicline (CHANTIX CONTINUING MONTH PAK) 1 MG tablet  ? ?  ?We will give smoking cessation also recommended reducing caffeine and will continue Xarelto until sees cardiologist ? ?Follow up plan: ?Return in about 9 months (around 12/04/2022), or if symptoms worsen or fail to improve, for Physical exam and recheck on A-fib. ? ?Counseling provided for all of the vaccine components ?Orders Placed This Encounter  ?Procedures  ? CBC with Differential/Platelet  ? CMP14+EGFR  ? Ambulatory referral to Cardiology  ? ? ?Caryl Pina, MD ?[place] ?03/04/2022, 4:43 PM ? ? ? ? ?

## 2022-03-05 LAB — CMP14+EGFR
ALT: 24 IU/L (ref 0–44)
AST: 24 IU/L (ref 0–40)
Albumin/Globulin Ratio: 2.2 (ref 1.2–2.2)
Albumin: 4.7 g/dL (ref 3.8–4.8)
Alkaline Phosphatase: 55 IU/L (ref 44–121)
BUN/Creatinine Ratio: 13 (ref 10–24)
BUN: 14 mg/dL (ref 8–27)
Bilirubin Total: 0.7 mg/dL (ref 0.0–1.2)
CO2: 21 mmol/L (ref 20–29)
Calcium: 9.5 mg/dL (ref 8.6–10.2)
Chloride: 102 mmol/L (ref 96–106)
Creatinine, Ser: 1.06 mg/dL (ref 0.76–1.27)
Globulin, Total: 2.1 g/dL (ref 1.5–4.5)
Glucose: 96 mg/dL (ref 70–99)
Potassium: 4.9 mmol/L (ref 3.5–5.2)
Sodium: 139 mmol/L (ref 134–144)
Total Protein: 6.8 g/dL (ref 6.0–8.5)
eGFR: 80 mL/min/{1.73_m2} (ref >59)

## 2022-03-05 LAB — CBC WITH DIFFERENTIAL/PLATELET
Basophils Absolute: 0.1 10*3/uL (ref 0.0–0.2)
Basos: 1 %
EOS (ABSOLUTE): 0.2 10*3/uL (ref 0.0–0.4)
Eos: 3 %
Hematocrit: 43.2 % (ref 37.5–51.0)
Hemoglobin: 14.6 g/dL (ref 13.0–17.7)
Immature Grans (Abs): 0.1 10*3/uL (ref 0.0–0.1)
Immature Granulocytes: 1 %
Lymphocytes Absolute: 1.9 10*3/uL (ref 0.7–3.1)
Lymphs: 24 %
MCH: 32.3 pg (ref 26.6–33.0)
MCHC: 33.8 g/dL (ref 31.5–35.7)
MCV: 96 fL (ref 79–97)
Monocytes Absolute: 0.6 10*3/uL (ref 0.1–0.9)
Monocytes: 8 %
Neutrophils Absolute: 5.1 10*3/uL (ref 1.4–7.0)
Neutrophils: 63 %
Platelets: 239 10*3/uL (ref 150–450)
RBC: 4.52 x10E6/uL (ref 4.14–5.80)
RDW: 12.6 % (ref 11.6–15.4)
WBC: 8 10*3/uL (ref 3.4–10.8)

## 2022-03-17 ENCOUNTER — Ambulatory Visit (HOSPITAL_COMMUNITY)
Admission: RE | Admit: 2022-03-17 | Discharge: 2022-03-17 | Disposition: A | Discharge status: Home / Self Care | Payer: BC Managed Care – PPO | Source: Ambulatory Visit | Attending: Physician Assistant | Admitting: Physician Assistant

## 2022-03-17 ENCOUNTER — Encounter (HOSPITAL_COMMUNITY): Payer: Self-pay | Admitting: Physician Assistant

## 2022-03-17 VITALS — BP 122/80 | HR 88 | Ht 73.0 in | Wt 208.4 lb

## 2022-03-17 DIAGNOSIS — F17210 Nicotine dependence, cigarettes, uncomplicated: Secondary | ICD-10-CM | POA: Diagnosis not present

## 2022-03-17 DIAGNOSIS — Z79899 Other long term (current) drug therapy: Secondary | ICD-10-CM | POA: Insufficient documentation

## 2022-03-17 DIAGNOSIS — R0681 Apnea, not elsewhere classified: Secondary | ICD-10-CM | POA: Insufficient documentation

## 2022-03-17 DIAGNOSIS — Z7901 Long term (current) use of anticoagulants: Secondary | ICD-10-CM | POA: Insufficient documentation

## 2022-03-17 DIAGNOSIS — I480 Paroxysmal atrial fibrillation: Secondary | ICD-10-CM | POA: Diagnosis not present

## 2022-03-17 MED ORDER — METOPROLOL TARTRATE 50 MG PO TABS: 25.0000 mg | ORAL_TABLET | Freq: Two times a day (BID) | ORAL | 1 refills | Status: DC

## 2022-03-17 NOTE — Progress Notes (Signed)
? ? ?Primary Care Physician: Dettinger, Fransisca Kaufmann, MD ?Primary Cardiologist: Dr Rockey Situ (remotely) ?Primary Electrophysiologist: Dr Caryl Comes (remotely) ?Referring Physician: [facility] ? ? ?Samuel Herman is a 61 y.o. male with a history of paroxysmal atrial fibrillation and tobacco abuse who presents for follow up in the [facility].  The patient was initially diagnosed with atrial fibrillation in February 2010 and underwent DCCV at that time. He did well until 12/2011 when he developed afib with RVR in the setting of a viral illness and had TEE/DCCV. He was given Xarelto for 4 weeks post procedure. He has seen Dr Caryl Comes remotely. More recently, patient seen in the ER on 11/19/19 with a mild chest discomfort and heart racing. ECG confirmed afib with RVR and he was again cardioverted and started on anticoagulation. He does admit to snoring and witnessed apnea.  ? ?On follow up today, patient presented to the ED on 02/24/22 with symptoms of fatigue which had started 3 days earlier. He was found to be back in afib and underwent DCCV at that time. He was discharged on metoprolol and Xarelto. He never started metoprolol.  ? ?Today, he denies symptoms of palpitations, chest pain, shortness of breath, orthopnea, PND, lower extremity edema, dizziness, presyncope, syncope, bleeding, or neurologic sequela. The patient is tolerating medications without difficulties and is otherwise without complaint today.  ? ? ?Atrial Fibrillation Risk Factors: ? ?he does have symptoms or diagnosis of sleep apnea. ?he does not have a history of rheumatic fever. ?he does have a history of alcohol use. ? ? ?he has a BMI of Body mass index is 27.5 kg/m?Marland KitchenMarland Kitchen ?Filed Weights  ? 03/17/22 1520  ?Weight: 94.5 kg  ? ? ? ?Family History  ?Problem Relation Age of Onset  ? Heart attack Father 73  ?     deceased  ? Cancer Mother   ?     breast  ? ? ? ?Atrial Fibrillation Management history: ? ?Previous antiarrhythmic drugs:  none ?Previous cardioversions: 2010, 2013, 11/19/2019, 02/24/22 ?Previous ablations: none ?CHADS2VASC score: 0 ?Anticoagulation history: Xarelto, Eliquis ? ? ?Past Medical History:  ?Diagnosis Date  ? Atrial fibrillation ([place])   ? DCCV 01/2009, not on ASA or antiarrythmic  ? Tobacco abuse   ? ?Past Surgical History:  ?Procedure Laterality Date  ? CARDIOVERSION    ? HERNIA REPAIR    ? VASECTOMY    ? ? ?Current Outpatient Medications  ?Medication Sig Dispense Refill  ? Multiple Vitamins-Minerals (MULTIVITAMIN ADULTS 50+ PO) Take by mouth.    ? rivaroxaban (XARELTO) 20 MG TABS tablet Take 1 tablet (20 mg total) by mouth daily with supper. 30 tablet 0  ? metoprolol tartrate (LOPRESSOR) 50 MG tablet Take 0.5 tablets (25 mg total) by mouth 2 (two) times daily. 45 tablet 1  ? varenicline (CHANTIX CONTINUING MONTH PAK) 1 MG tablet Take 1 tablet (1 mg total) by mouth 2 (two) times daily. (Patient not taking: Reported on 03/17/2022) 60 tablet 1  ? Varenicline Tartrate, Starter, (CHANTIX STARTING MONTH PAK) 0.5 MG X 11 & 1 MG X 42 TBPK Take 0.5 mg by mouth daily for 3 days, THEN 0.5 mg 2 (two) times daily for 4 days, THEN 1 mg 2 (two) times daily for 23 days. (Patient not taking: Reported on 03/17/2022) 42 each 0  ? ?No current facility-administered medications for this encounter.  ? ? ?Allergies  ?Allergen Reactions  ? Other Other (See Comments)  ?  There was an antibiotic that caused  hives about 20 years ago.unsure of the name.  ? ? ?Social History  ? ?Socioeconomic History  ? Marital status: Married  ?  Spouse name: Not on file  ? Number of children: Not on file  ? Years of education: Not on file  ? Highest education level: Not on file  ?Occupational History  ? Occupation: Surveyor, quantity  ?Tobacco Use  ? Smoking status: Every Day  ?  Packs/day: 1.00  ?  Years: 40.00  ?  Pack years: 40.00  ?  Types: Cigarettes  ? Smokeless tobacco: Never  ? Tobacco comments:  ?  Pack daily 03/17/22  ?Vaping Use  ? Vaping Use: Never used  ?Substance and  Sexual Activity  ? Alcohol use: Yes  ?  Alcohol/week: 1.0 - 2.0 standard drink  ?  Types: 1 - 2 Cans of beer per week  ?  Comment: 1-2 beers once every two weeks 03/17/22  ? Drug use: Yes  ?  Types: Marijuana  ?  Comment: occasionally, mushrooms  ? Sexual activity: Yes  ?  Comment: married 34 years  ?Other Topics Concern  ? Not on file  ?Social History Narrative  ? Not on file  ? ?Social Determinants of Health  ? ?Financial Resource Strain: Not on file  ?Food Insecurity: Not on file  ?Transportation Needs: Not on file  ?Physical Activity: Not on file  ?Stress: Not on file  ?Social Connections: Not on file  ?Intimate Partner Violence: Not on file  ? ? ? ?ROS- All systems are reviewed and negative except as per the HPI above. ? ?Physical Exam: ?Vitals:  ? 03/17/22 1520  ?BP: 122/80  ?Pulse: 88  ?Weight: 94.5 kg  ?Height: 6\' 1"  (1.854 m)  ? ? ?GEN- The patient is a well appearing male, alert and oriented x 3 today.   ?HEENT-head normocephalic, atraumatic, sclera clear, conjunctiva pink, hearing intact, trachea midline. ?Lungs- Clear to ausculation bilaterally, normal work of breathing ?Heart- Regular rate and rhythm, no murmurs, rubs or gallops  ?GI- soft, NT, ND, + BS ?Extremities- no clubbing, cyanosis, or edema ?MS- no significant deformity or atrophy ?Skin- no rash or lesion ?Psych- euthymic mood, full affect ?Neuro- strength and sensation are intact ? ? ?Wt Readings from Last 3 Encounters:  ?03/17/22 94.5 kg  ?03/04/22 94.8 kg  ?02/24/22 94 kg  ? ? ?EKG today demonstrates  ?SR ?Vent. rate 88 BPM ?PR interval 150 ms ?QRS duration 92 ms ?QT/QTcB 352/425 ms ? ?Echo 12/01/19 ?1. Left ventricular ejection fraction, by visual estimation, is 55 to 60%. The left ventricle has normal function. There is no left ventricular hypertrophy. ? 2. Left ventricular diastolic parameters are consistent with Grade I diastolic dysfunction (impaired relaxation). ? 3. Mildly dilated left ventricular internal cavity size. ? 4. The left  ventricle has no regional wall motion abnormalities. ? 5. Global right ventricle has normal systolic function.The right ventricular size is normal. No increase in right ventricular wall thickness. ? 6. Left atrial size was normal. ? 7. Right atrial size was normal. ? 8. The mitral valve is normal in structure. Trivial mitral valve regurgitation. No evidence of mitral stenosis. ? 9. The tricuspid valve is normal in structure. Tricuspid valve regurgitation is trivial. ?10. The aortic valve is normal in structure. Aortic valve regurgitation is not visualized. No evidence of aortic valve sclerosis or stenosis. ?11. The pulmonic valve was normal in structure. Pulmonic valve regurgitation is not visualized. ?12. Normal pulmonary artery systolic pressure. ?13. The inferior vena cava  is normal in size with greater than 50% respiratory variability, suggesting right atrial pressure of 3 mmHg. ? ?Epic records are reviewed at length today ? ?CHA2DS2-VASc Score = 0  ?The patient's score is based upon: ?CHF History: 0 ?HTN History: 0 ?Diabetes History: 0 ?Stroke History: 0 ?Vascular Disease History: 0 ?Age Score: 0 ?Gender Score: 0 ?    ? ? ?ASSESSMENT AND PLAN: ?1. Paroxysmal Atrial Fibrillation (ICD10:  I48.0) ?The patient's CHA2DS2-VASc score is 0, indicating a 0.2% annual risk of stroke.  ?S/p DCCV 02/24/22 ?He is in SR today.  ?We discussed rhythm control options given he has had two ED visits requiring DCCV in two years.  ?We discussed AAD and ablation. He is not interested in either options at this time. He did agree to start low dose BB. ?Start metoprolol 25 mg BID ?Continue Xarelto 20 mg daily for at least 4 weeks post DCCV with no missed doses. May discontinue at that time with low CV score.  ? ?2. Snoring/witnessed apnea ?Referred for sleep study but not completed.  ? ? ?Follow up in the AF clinic in 3 months.  ? ? ?Ricky Jenalee Trevizo PA-C ?[facility] ?[facility] ?[address] ?[place], [place]  [address] ?[phone] ?03/17/2022 ?4:14 PM ?

## 2022-06-18 ENCOUNTER — Ambulatory Visit (HOSPITAL_COMMUNITY): Payer: BC Managed Care – PPO | Admitting: Physician Assistant

## 2022-06-23 ENCOUNTER — Ambulatory Visit (HOSPITAL_COMMUNITY): Payer: BC Managed Care – PPO | Admitting: Physician Assistant

## 2022-12-01 ENCOUNTER — Encounter: Payer: Self-pay | Admitting: Family Medicine

## 2022-12-01 ENCOUNTER — Ambulatory Visit (INDEPENDENT_AMBULATORY_CARE_PROVIDER_SITE_OTHER): Payer: BC Managed Care – PPO | Admitting: Family Medicine

## 2022-12-01 DIAGNOSIS — K529 Noninfective gastroenteritis and colitis, unspecified: Secondary | ICD-10-CM

## 2022-12-01 MED ORDER — ONDANSETRON HCL 4 MG PO TABS: 4.0000 mg | ORAL_TABLET | Freq: Three times a day (TID) | ORAL | 0 refills | Status: DC | PRN

## 2022-12-01 NOTE — Progress Notes (Signed)
Virtual Visit via telephone Note Due to COVID-19 pandemic this visit was conducted virtually. This visit type was conducted due to national recommendations for restrictions regarding the COVID-19 Pandemic (e.g. social distancing, sheltering in place) in an effort to limit this patient's exposure and mitigate transmission in our community. All issues noted in this document were discussed and addressed.  A physical exam was not performed with this format.   I connected with Samuel Herman on 12/01/2022 at 1035 by telephone and verified that I am speaking with the correct person using two identifiers. Samuel Herman is currently located at home and family is currently with them during visit. The provider, Kari Baars, FNP is located in their office at time of visit.  I discussed the limitations, risks, security and privacy concerns of performing an evaluation and management service by virtual visit and the availability of in person appointments. I also discussed with the patient that there may be a patient responsible charge related to this service. The patient expressed understanding and agreed to proceed.  Subjective:  Patient ID: Samuel Herman, male    DOB: 10-14-1961, 61 y.o.   MRN: [id number]  Chief Complaint:  Diarrhea   HPI: Samuel Herman is a 61 y.o. male presenting on 12/01/2022 for Diarrhea   Pt reports onset of diarrhea Thursday. Improved gradually until he went to eat [place] and it returned. Has gradually improved since then but has missed work due to the diarrhea. Did have some nausea with the diarrhea. No other associated symptoms.   Diarrhea  This is a new problem. Episode onset: Thursday. The problem occurs 2 to 4 times per day. The problem has been gradually improving. The patient states that diarrhea does not awaken him from sleep. Pertinent negatives include no abdominal pain, arthralgias, bloating, chills, coughing, fever, headaches, increased  flatus, myalgias,  sweats, URI, vomiting or weight loss. Exacerbated by: greasy foods. He has tried nothing for the symptoms.     Relevant past medical, surgical, family, and social history reviewed and updated as indicated.  Allergies and medications reviewed and updated.   Past Medical History:  Diagnosis Date   Atrial fibrillation (HCC)    DCCV 01/2009, not on ASA or antiarrythmic   Tobacco abuse     Past Surgical History:  Procedure Laterality Date   CARDIOVERSION     HERNIA REPAIR     VASECTOMY      Social History   Socioeconomic History   Marital status: Married    Spouse name: Not on file   Number of children: Not on file   Years of education: Not on file   Highest education level: Not on file  Occupational History   Occupation: Insurance risk surveyor  Tobacco Use   Smoking status: Every Day    Packs/day: 1.00    Years: 40.00    Total pack years: 40.00    Types: Cigarettes   Smokeless tobacco: Never   Tobacco comments:    Pack daily 03/17/22  Vaping Use   Vaping Use: Never used  Substance and Sexual Activity   Alcohol use: Yes    Alcohol/week: 1.0 - 2.0 standard drink of alcohol    Types: 1 - 2 Cans of beer per week    Comment: 1-2 beers once every two weeks 03/17/22   Drug use: Yes    Types: Marijuana    Comment: occasionally, mushrooms   Sexual activity: Yes    Comment: married 34 years  Other Topics Concern  Not on file  Social History Narrative   Not on file   Social Determinants of Health   Financial Resource Strain: Not on file  Food Insecurity: Not on file  Transportation Needs: Not on file  Physical Activity: Not on file  Stress: Not on file  Social Connections: Not on file  Intimate Partner Violence: Not on file    Outpatient Encounter Medications as of 12/01/2022  Medication Sig   ondansetron (ZOFRAN) 4 MG tablet Take 1 tablet (4 mg total) by mouth every 8 (eight) hours as needed for nausea or vomiting.   metoprolol tartrate (LOPRESSOR) 50 MG tablet Take 0.5  tablets (25 mg total) by mouth 2 (two) times daily.   Multiple Vitamins-Minerals (MULTIVITAMIN ADULTS 50+ PO) Take by mouth.   rivaroxaban (XARELTO) 20 MG TABS tablet Take 1 tablet (20 mg total) by mouth daily with supper.   varenicline (CHANTIX CONTINUING MONTH PAK) 1 MG tablet Take 1 tablet (1 mg total) by mouth 2 (two) times daily. (Patient not taking: Reported on 03/17/2022)   No facility-administered encounter medications on file as of 12/01/2022.    Allergies  Allergen Reactions   Other Other (See Comments)    There was an antibiotic that caused hives about 20 years ago.unsure of the name.    Review of Systems  Constitutional:  Positive for appetite change. Negative for activity change, chills, diaphoresis, fatigue, fever, unexpected weight change and weight loss.  HENT: Negative.    Eyes: Negative.   Respiratory:  Negative for cough, chest tightness and shortness of breath.   Cardiovascular:  Negative for chest pain, palpitations and leg swelling.  Gastrointestinal:  Positive for diarrhea and nausea. Negative for abdominal distention, abdominal pain, anal bleeding, bloating, blood in stool, constipation, flatus, rectal pain and vomiting.  Endocrine: Negative.   Genitourinary:  Negative for decreased urine volume, difficulty urinating, dysuria, frequency and urgency.  Musculoskeletal:  Negative for arthralgias and myalgias.  Skin: Negative.   Allergic/Immunologic: Negative.   Neurological:  Negative for dizziness, weakness and headaches.  Hematological: Negative.   Psychiatric/Behavioral:  Negative for confusion, hallucinations, sleep disturbance and suicidal ideas.   All other systems reviewed and are negative.        Observations/Objective: No vital signs or physical exam, this was a virtual health encounter.  Pt alert and oriented, answers all questions appropriately, and able to speak in full sentences.    Assessment and Plan: Samuel Herman was seen today for  diarrhea.  Diagnoses and all orders for this visit:  Gastroenteritis BRAT diet discussed in detail. Encouraged to advance slowly and only as tolerated. Report persistent symptoms or new or worsening symptoms.  -     ondansetron (ZOFRAN) 4 MG tablet; Take 1 tablet (4 mg total) by mouth every 8 (eight) hours as needed for nausea or vomiting.    Follow Up Instructions: Return if symptoms worsen or fail to improve.    I discussed the assessment and treatment plan with the patient. The patient was provided an opportunity to ask questions and all were answered. The patient agreed with the plan and demonstrated an understanding of the instructions.   The patient was advised to call back or seek an in-person evaluation if the symptoms worsen or if the condition fails to improve as anticipated.  The above assessment and management plan was discussed with the patient. The patient verbalized understanding of and has agreed to the management plan. Patient is aware to call the clinic if they develop any new symptoms or  if symptoms persist or worsen. Patient is aware when to return to the clinic for a follow-up visit. Patient educated on when it is appropriate to go to the emergency department.    I provided 15 minutes of time during this telephone encounter.   Kari Baars, FNP-C [facility] [address] [place], [place] [address] [phone] 12/01/2022

## 2022-12-01 NOTE — Patient Instructions (Signed)
BRAT diet encouraged.

## 2022-12-15 ENCOUNTER — Encounter: Payer: Self-pay | Admitting: Family

## 2022-12-15 ENCOUNTER — Telehealth (INDEPENDENT_AMBULATORY_CARE_PROVIDER_SITE_OTHER): Payer: BC Managed Care – PPO | Admitting: Family

## 2022-12-15 DIAGNOSIS — R519 Headache, unspecified: Secondary | ICD-10-CM

## 2022-12-15 DIAGNOSIS — R059 Cough, unspecified: Secondary | ICD-10-CM

## 2022-12-15 DIAGNOSIS — R6889 Other general symptoms and signs: Secondary | ICD-10-CM

## 2022-12-15 MED ORDER — ALBUTEROL SULFATE HFA 108 (90 BASE) MCG/ACT IN AERS: 2.0000 | INHALATION_SPRAY | Freq: Four times a day (QID) | RESPIRATORY_TRACT | 0 refills | Status: DC | PRN

## 2022-12-15 MED ORDER — FLUTICASONE PROPIONATE 50 MCG/ACT NA SUSP: 2.0000 | Freq: Every day | NASAL | 6 refills | Status: DC

## 2022-12-15 MED ORDER — OSELTAMIVIR PHOSPHATE 75 MG PO CAPS: 75.0000 mg | ORAL_CAPSULE | Freq: Two times a day (BID) | ORAL | 0 refills | Status: DC

## 2022-12-15 MED ORDER — BENZONATATE 200 MG PO CAPS: 200.0000 mg | ORAL_CAPSULE | Freq: Three times a day (TID) | ORAL | 1 refills | Status: DC | PRN

## 2022-12-15 NOTE — Progress Notes (Signed)
Virtual Visit Consent   Samuel Herman, you are scheduled for a virtual visit with a [facility] provider today. Just as with appointments in the office, your consent must be obtained to participate. Your consent will be active for this visit and any virtual visit you may have with one of our providers in the next 365 days. If you have a MyChart account, a copy of this consent can be sent to you electronically.  As this is a virtual visit, video technology does not allow for your provider to perform a traditional examination. This may limit your provider's ability to fully assess your condition. If your provider identifies any concerns that need to be evaluated in person or the need to arrange testing (such as labs, EKG, etc.), we will make arrangements to do so. Although advances in technology are sophisticated, we cannot ensure that it will always work on either your end or our end. If the connection with a video visit is poor, the visit may have to be switched to a telephone visit. With either a video or telephone visit, we are not always able to ensure that we have a secure connection.  By engaging in this virtual visit, you consent to the provision of healthcare and authorize for your insurance to be billed (if applicable) for the services provided during this visit. Depending on your insurance coverage, you may receive a charge related to this service.  I need to obtain your verbal consent now. Are you willing to proceed with your visit today? Samuel Herman has provided verbal consent on 12/15/2022 for a virtual visit (video or telephone). Samuel Dun, FNP  Date: 12/15/2022 10:30 AM  Virtual Visit via Video Note   I, Samuel Herman, connected with  Samuel Herman  ([id number], 09-Oct-1961) on 12/15/22 at  2:10 PM EST by a video-enabled telemedicine application and verified that I am speaking with the correct person using two identifiers.  Location: Patient: Virtual Visit Location Patient:  Home Provider: Virtual Visit Location Provider: Office/Clinic   I discussed the limitations of evaluation and management by telemedicine and the availability of in person appointments. The patient expressed understanding and agreed to proceed.    History of Present Illness: Samuel Herman is a 62 y.o. who identifies as a male who was assigned male at birth, and is being seen today for congestion that started two days ago.  HPI: URI  This is a new problem. The current episode started in the past 7 days. The problem has been unchanged. There has been no fever. Associated symptoms include congestion, coughing, headaches, rhinorrhea, sinus pain and sneezing. Pertinent negatives include no diarrhea, ear pain, joint pain, joint swelling, nausea, sore throat or vomiting. He has tried acetaminophen for the symptoms. The treatment provided mild relief.    Problems:  Patient Active Problem List   Diagnosis Date Noted   BMI 27.0-27.9,adult 12/05/2019   Paroxysmal atrial fibrillation ([place]) 11/23/2019   DDD (degenerative disc disease), cervical 12/28/2017   Atrial fibrillation with RVR ([place]) 12/16/2011    Allergies:  Allergies  Allergen Reactions   Other Other (See Comments)    There was an antibiotic that caused hives about 20 years ago.unsure of the name.   Medications:  Current Outpatient Medications:    albuterol (VENTOLIN HFA) 108 (90 Base) MCG/ACT inhaler, Inhale 2 puffs into the lungs every 6 (six) hours as needed for wheezing or shortness of breath., Disp: 8 g, Rfl: 0   benzonatate (TESSALON) 200 MG capsule,  Take 1 capsule (200 mg total) by mouth 3 (three) times daily as needed., Disp: 30 capsule, Rfl: 1   fluticasone (FLONASE) 50 MCG/ACT nasal spray, Place 2 sprays into both nostrils daily., Disp: 16 g, Rfl: 6   oseltamivir (TAMIFLU) 75 MG capsule, Take 1 capsule (75 mg total) by mouth 2 (two) times daily., Disp: 10 capsule, Rfl: 0   metoprolol tartrate (LOPRESSOR) 50 MG tablet, Take 0.5  tablets (25 mg total) by mouth 2 (two) times daily. (Patient not taking: Reported on 12/15/2022), Disp: 45 tablet, Rfl: 1   Multiple Vitamins-Minerals (MULTIVITAMIN ADULTS 50+ PO), Take by mouth. (Patient not taking: Reported on 12/15/2022), Disp: , Rfl:    ondansetron (ZOFRAN) 4 MG tablet, Take 1 tablet (4 mg total) by mouth every 8 (eight) hours as needed for nausea or vomiting. (Patient not taking: Reported on 12/15/2022), Disp: 20 tablet, Rfl: 0   rivaroxaban (XARELTO) 20 MG TABS tablet, Take 1 tablet (20 mg total) by mouth daily with supper. (Patient not taking: Reported on 12/15/2022), Disp: 30 tablet, Rfl: 0  Observations/Objective: Patient is well-developed, well-nourished in no acute distress.  Resting comfortably  at home.  Head is normocephalic, atraumatic.  No labored breathing.  Speech is clear and coherent with logical content.  Patient is alert and oriented at baseline.    Assessment and Plan: 1. Flu-like symptoms - oseltamivir (TAMIFLU) 75 MG capsule; Take 1 capsule (75 mg total) by mouth 2 (two) times daily.  Dispense: 10 capsule; Refill: 0 - fluticasone (FLONASE) 50 MCG/ACT nasal spray; Place 2 sprays into both nostrils daily.  Dispense: 16 g; Refill: 6 - benzonatate (TESSALON) 200 MG capsule; Take 1 capsule (200 mg total) by mouth 3 (three) times daily as needed.  Dispense: 30 capsule; Refill: 1 - albuterol (VENTOLIN HFA) 108 (90 Base) MCG/ACT inhaler; Inhale 2 puffs into the lungs every 6 (six) hours as needed for wheezing or shortness of breath.  Dispense: 8 g; Refill: 0  - Take meds as prescribed - Use a cool mist humidifier  -Use saline nose sprays frequently -Force fluids -For any cough or congestion  Use plain Mucinex- regular strength or max strength is fine -For fever or aces or pains- take tylenol or ibuprofen. -Throat lozenges if help -Follow up if symptoms worsen or do not improve   Follow Up Instructions: I discussed the assessment and treatment plan with the  patient. The patient was provided an opportunity to ask questions and all were answered. The patient agreed with the plan and demonstrated an understanding of the instructions.  A copy of instructions were sent to the patient via MyChart unless otherwise noted below.     The patient was advised to call back or seek an in-person evaluation if the symptoms worsen or if the condition fails to improve as anticipated.  Time:  I spent 12 minutes with the patient via telehealth technology discussing the above problems/concerns.    Samuel Dun, FNP

## 2023-02-05 ENCOUNTER — Ambulatory Visit (INDEPENDENT_AMBULATORY_CARE_PROVIDER_SITE_OTHER): Payer: BC Managed Care – PPO | Admitting: Family Medicine

## 2023-02-05 ENCOUNTER — Encounter: Payer: Self-pay | Admitting: Family Medicine

## 2023-02-05 VITALS — BP 119/69 | HR 71 | Ht 73.0 in | Wt 209.0 lb

## 2023-02-05 DIAGNOSIS — Z125 Encounter for screening for malignant neoplasm of prostate: Secondary | ICD-10-CM

## 2023-02-05 DIAGNOSIS — Z131 Encounter for screening for diabetes mellitus: Secondary | ICD-10-CM | POA: Diagnosis not present

## 2023-02-05 DIAGNOSIS — Z0000 Encounter for general adult medical examination without abnormal findings: Secondary | ICD-10-CM

## 2023-02-05 DIAGNOSIS — Z13220 Encounter for screening for lipoid disorders: Secondary | ICD-10-CM

## 2023-02-05 DIAGNOSIS — Z1211 Encounter for screening for malignant neoplasm of colon: Secondary | ICD-10-CM

## 2023-02-05 NOTE — Progress Notes (Signed)
BP 119/69   Pulse 71   Ht '6\' 1"'$  (1.854 m)   Wt 209 lb (94.8 kg)   SpO2 98%   BMI 27.57 kg/m    Subjective:   Patient ID: Samuel Herman, male    DOB: 11-Oct-1961, 62 y.o.   MRN: [id number]  HPI: Samuel Herman is a 62 y.o. male presenting on 02/05/2023 for Medical Management of Chronic Issues (CPE)   HPI Physical exam Patient is coming in today for physical exam and recheck.  He says that he was diagnosed with A-fib couple years ago and has not had any issues with it since.  He denies any chest pain or palpitations. Patient denies any chest pain, shortness of breath, headaches or vision issues, abdominal complaints, diarrhea, nausea, vomiting, or joint issues.   Relevant past medical, surgical, family and social history reviewed and updated as indicated. Interim medical history since our last visit reviewed. Allergies and medications reviewed and updated.  Review of Systems  Constitutional:  Negative for chills and fever.  HENT:  Negative for ear pain and tinnitus.   Respiratory:  Negative for cough, shortness of breath and wheezing.   Cardiovascular:  Negative for chest pain, palpitations and leg swelling.  Gastrointestinal:  Negative for abdominal pain, blood in stool, constipation and diarrhea.  Genitourinary:  Negative for dysuria and hematuria.  Musculoskeletal:  Negative for back pain and myalgias.  Skin:  Negative for rash.  Neurological:  Negative for dizziness, weakness and headaches.  Psychiatric/Behavioral:  Negative for suicidal ideas.     Per HPI unless specifically indicated above   Allergies as of 02/05/2023       Reactions   Other Other (See Comments)   There was an antibiotic that caused hives about 20 years ago.unsure of the name.        Medication List        Accurate as of February 05, 2023  2:56 PM. If you have any questions, ask your nurse or doctor.          STOP taking these medications    albuterol 108 (90 Base) MCG/ACT  inhaler Commonly known as: VENTOLIN HFA Stopped by: Fransisca Kaufmann Jaimere Feutz, MD   benzonatate 200 MG capsule Commonly known as: TESSALON Stopped by: Fransisca Kaufmann Shaunice Levitan, MD   metoprolol tartrate 50 MG tablet Commonly known as: LOPRESSOR Stopped by: Worthy Rancher, MD   MULTIVITAMIN ADULTS 50+ PO Stopped by: Worthy Rancher, MD   ondansetron 4 MG tablet Commonly known as: Zofran Stopped by: Worthy Rancher, MD   oseltamivir 75 MG capsule Commonly known as: Tamiflu Stopped by: Worthy Rancher, MD   rivaroxaban 20 MG Tabs tablet Commonly known as: XARELTO Stopped by: Fransisca Kaufmann Leylah Tarnow, MD       TAKE these medications    fluticasone 50 MCG/ACT nasal spray Commonly known as: FLONASE Place 2 sprays into both nostrils daily.         Objective:   BP 119/69   Pulse 71   Ht '6\' 1"'$  (1.854 m)   Wt 209 lb (94.8 kg)   SpO2 98%   BMI 27.57 kg/m   Wt Readings from Last 3 Encounters:  02/05/23 209 lb (94.8 kg)  03/17/22 208 lb 6.4 oz (94.5 kg)  03/04/22 209 lb (94.8 kg)    Physical Exam Vitals reviewed.  Constitutional:      General: He is not in acute distress.    Appearance: He is well-developed. He is not diaphoretic.  HENT:     Right Ear: External ear normal.     Left Ear: External ear normal.     Nose: Nose normal.     Mouth/Throat:     Pharynx: No oropharyngeal exudate.  Eyes:     General: No scleral icterus.       Right eye: No discharge.     Conjunctiva/sclera: Conjunctivae normal.     Pupils: Pupils are equal, round, and reactive to light.  Neck:     Thyroid: No thyromegaly.  Cardiovascular:     Rate and Rhythm: Normal rate and regular rhythm.     Heart sounds: Normal heart sounds. No murmur heard. Pulmonary:     Effort: Pulmonary effort is normal. No respiratory distress.     Breath sounds: Normal breath sounds. No wheezing.  Abdominal:     General: Bowel sounds are normal. There is no distension.     Palpations: Abdomen is soft.      Tenderness: There is no abdominal tenderness. There is no guarding or rebound.  Musculoskeletal:        General: Normal range of motion.     Cervical back: Neck supple.  Lymphadenopathy:     Cervical: No cervical adenopathy.  Skin:    General: Skin is warm and dry.     Findings: No rash.  Neurological:     Mental Status: He is alert and oriented to person, place, and time.     Coordination: Coordination normal.  Psychiatric:        Behavior: Behavior normal.       Assessment & Plan:   Problem List Items Addressed This Visit   None Visit Diagnoses     Well adult exam    -  Primary   Relevant Orders   CBC with Differential/Platelet   CMP14+EGFR   Lipid panel   PSA, total and free   Prostate cancer screening       Relevant Orders   PSA, total and free   Diabetes mellitus screening       Relevant Orders   CBC with Differential/Platelet   CMP14+EGFR   Lipid screening       Relevant Orders   Lipid panel   Colon cancer screening       Relevant Orders   Cologuard     Will check blood work today.  Health wise patient seems to be doing okay minus a little bit of anxiety.  Follow up plan: Return in about 1 year (around 02/06/2024), or if symptoms worsen or fail to improve, for Physical exam.  Counseling provided for all of the vaccine components Orders Placed This Encounter  Procedures   CBC with Differential/Platelet   CMP14+EGFR   Lipid panel   PSA, total and free   Cologuard    Caryl Pina, MD [facility] 02/05/2023, 2:56 PM

## 2023-02-06 LAB — CBC WITH DIFFERENTIAL/PLATELET
Basophils Absolute: 0 10*3/uL (ref 0.0–0.2)
Basos: 0 %
EOS (ABSOLUTE): 0.2 10*3/uL (ref 0.0–0.4)
Eos: 3 %
Hematocrit: 42.7 % (ref 37.5–51.0)
Hemoglobin: 14.5 g/dL (ref 13.0–17.7)
Immature Grans (Abs): 0 10*3/uL (ref 0.0–0.1)
Immature Granulocytes: 1 %
Lymphocytes Absolute: 1.6 10*3/uL (ref 0.7–3.1)
Lymphs: 20 %
MCH: 32.7 pg (ref 26.6–33.0)
MCHC: 34 g/dL (ref 31.5–35.7)
MCV: 96 fL (ref 79–97)
Monocytes Absolute: 0.6 10*3/uL (ref 0.1–0.9)
Monocytes: 7 %
Neutrophils Absolute: 5.3 10*3/uL (ref 1.4–7.0)
Neutrophils: 69 %
Platelets: 226 10*3/uL (ref 150–450)
RBC: 4.43 x10E6/uL (ref 4.14–5.80)
RDW: 12.3 % (ref 11.6–15.4)
WBC: 7.7 10*3/uL (ref 3.4–10.8)

## 2023-02-06 LAB — CMP14+EGFR
ALT: 22 IU/L (ref 0–44)
AST: 21 IU/L (ref 0–40)
Albumin/Globulin Ratio: 1.8 (ref 1.2–2.2)
Albumin: 4.4 g/dL (ref 3.9–4.9)
Alkaline Phosphatase: 56 IU/L (ref 44–121)
BUN/Creatinine Ratio: 13 (ref 10–24)
BUN: 14 mg/dL (ref 8–27)
Bilirubin Total: 0.9 mg/dL (ref 0.0–1.2)
CO2: 20 mmol/L (ref 20–29)
Calcium: 9.4 mg/dL (ref 8.6–10.2)
Chloride: 105 mmol/L (ref 96–106)
Creatinine, Ser: 1.04 mg/dL (ref 0.76–1.27)
Globulin, Total: 2.4 g/dL (ref 1.5–4.5)
Glucose: 92 mg/dL (ref 70–99)
Potassium: 4.3 mmol/L (ref 3.5–5.2)
Sodium: 141 mmol/L (ref 134–144)
Total Protein: 6.8 g/dL (ref 6.0–8.5)
eGFR: 81 mL/min/1.73

## 2023-02-06 LAB — LIPID PANEL
Chol/HDL Ratio: 3.2 ratio (ref 0.0–5.0)
Cholesterol, Total: 111 mg/dL (ref 100–199)
HDL: 35 mg/dL — ABNORMAL LOW
LDL Chol Calc (NIH): 53 mg/dL (ref 0–99)
Triglycerides: 126 mg/dL (ref 0–149)
VLDL Cholesterol Cal: 23 mg/dL (ref 5–40)

## 2023-02-06 LAB — PSA, TOTAL AND FREE
PSA, Free Pct: 48 %
PSA, Free: 0.48 ng/mL
Prostate Specific Ag, Serum: 1 ng/mL (ref 0.0–4.0)

## 2023-03-15 ENCOUNTER — Encounter: Payer: Self-pay | Admitting: Family

## 2023-03-15 ENCOUNTER — Telehealth (INDEPENDENT_AMBULATORY_CARE_PROVIDER_SITE_OTHER): Payer: BC Managed Care – PPO | Admitting: Family

## 2023-03-15 ENCOUNTER — Telehealth: Payer: Self-pay | Admitting: Family Medicine

## 2023-03-15 DIAGNOSIS — J069 Acute upper respiratory infection, unspecified: Secondary | ICD-10-CM

## 2023-03-15 MED ORDER — CETIRIZINE HCL 10 MG PO TABS: 10.0000 mg | ORAL_TABLET | Freq: Every day | ORAL | 1 refills | Status: DC

## 2023-03-15 NOTE — Telephone Encounter (Signed)
Samuel Herman has been made aware and understood. She will try Nyquil instead of Mucinex because that what she already has.  Will call back if needed.

## 2023-03-15 NOTE — Telephone Encounter (Signed)
Ok, will treat as flu. As discussed he is out of window for antiviral. Rest, force fluids, Tylenol as needed, and Mucinex. Follow up in in person if symptoms worsen or do not improve.

## 2023-03-15 NOTE — Progress Notes (Signed)
Virtual Visit Consent   Samuel Herman, you are scheduled for a virtual visit with a [facility] provider today. Just as with appointments in the office, your consent must be obtained to participate. Your consent will be active for this visit and any virtual visit you may have with one of our providers in the next 365 days. If you have a MyChart account, a copy of this consent can be sent to you electronically.  As this is a virtual visit, video technology does not allow for your provider to perform a traditional examination. This may limit your provider's ability to fully assess your condition. If your provider identifies any concerns that need to be evaluated in person or the need to arrange testing (such as labs, EKG, etc.), we will make arrangements to do so. Although advances in technology are sophisticated, we cannot ensure that it will always work on either your end or our end. If the connection with a video visit is poor, the visit may have to be switched to a telephone visit. With either a video or telephone visit, we are not always able to ensure that we have a secure connection.  By engaging in this virtual visit, you consent to the provision of healthcare and authorize for your insurance to be billed (if applicable) for the services provided during this visit. Depending on your insurance coverage, you may receive a charge related to this service.  I need to obtain your verbal consent now. Are you willing to proceed with your visit today? Samuel Herman has provided verbal consent on 03/15/2023 for a virtual visit (video or telephone). Evelina Dun, FNP  Date: 03/15/2023 11:28 AM  Virtual Visit via Video Note   I, Evelina Dun, connected with  Samuel Herman  ([place]:[id number], 1961/03/03) on 03/15/23 at  4:45 PM EDT by a video-enabled telemedicine application and verified that I am speaking with the correct person using two identifiers.  Location: Patient: Virtual Visit Location Patient:  Home Provider: Virtual Visit Location Provider: Office/Clinic   I discussed the limitations of evaluation and management by telemedicine and the availability of in person appointments. The patient expressed understanding and agreed to proceed.    History of Present Illness: Samuel Herman is a 62 y.o. who identifies as a male who was assigned male at birth, and is being seen today for body aches that started 3 days ago.  HPI: URI  This is a new problem. The current episode started in the past 7 days. Maximum temperature: low grade. Associated symptoms include congestion, ear pain, headaches, nausea, rhinorrhea, sinus pain, sneezing and wheezing. Pertinent negatives include no coughing or sore throat. He has tried acetaminophen for the symptoms. The treatment provided mild relief.    Problems:  Patient Active Problem List   Diagnosis Date Noted   BMI 27.0-27.9,adult 12/05/2019   Paroxysmal atrial fibrillation 11/23/2019   DDD (degenerative disc disease), cervical 12/28/2017   Atrial fibrillation with RVR 12/16/2011    Allergies:  Allergies  Allergen Reactions   Other Other (See Comments)    There was an antibiotic that caused hives about 20 years ago.unsure of the name.   Medications:  Current Outpatient Medications:    cetirizine (ZYRTEC ALLERGY) 10 MG tablet, Take 1 tablet (10 mg total) by mouth daily., Disp: 90 tablet, Rfl: 1   fluticasone (FLONASE) 50 MCG/ACT nasal spray, Place 2 sprays into both nostrils daily., Disp: 16 g, Rfl: 6  Observations/Objective: Patient is well-developed, well-nourished in no acute distress.  Resting comfortably  at home.  Head is normocephalic, atraumatic.  No labored breathing.  Speech is clear and coherent with logical content.  Patient is alert and oriented at baseline.    Assessment and Plan: 1. Viral URI - cetirizine (ZYRTEC ALLERGY) 10 MG tablet; Take 1 tablet (10 mg total) by mouth daily.  Dispense: 90 tablet; Refill: 1  PT will take  a home COVID test to rule out Rest Force fluids Tylenol  Start Zyrtec and continue Flonase Follow up if symptoms worsen or do not improve   Follow Up Instructions: I discussed the assessment and treatment plan with the patient. The patient was provided an opportunity to ask questions and all were answered. The patient agreed with the plan and demonstrated an understanding of the instructions.  A copy of instructions were sent to the patient via MyChart unless otherwise noted below.    The patient was advised to call back or seek an in-person evaluation if the symptoms worsen or if the condition fails to improve as anticipated.  Time:  I spent 12 minutes with the patient via telehealth technology discussing the above problems/concerns.    Evelina Dun, FNP

## 2024-01-06 IMAGING — DX DG CHEST 1V PORT
1 series · 1 of 1 positions shown · non-contrast
Comparison: 12/25/2021

CLINICAL DATA: Atrial fibrillation

EXAM:
PORTABLE CHEST 1 VIEW

[chest ap]
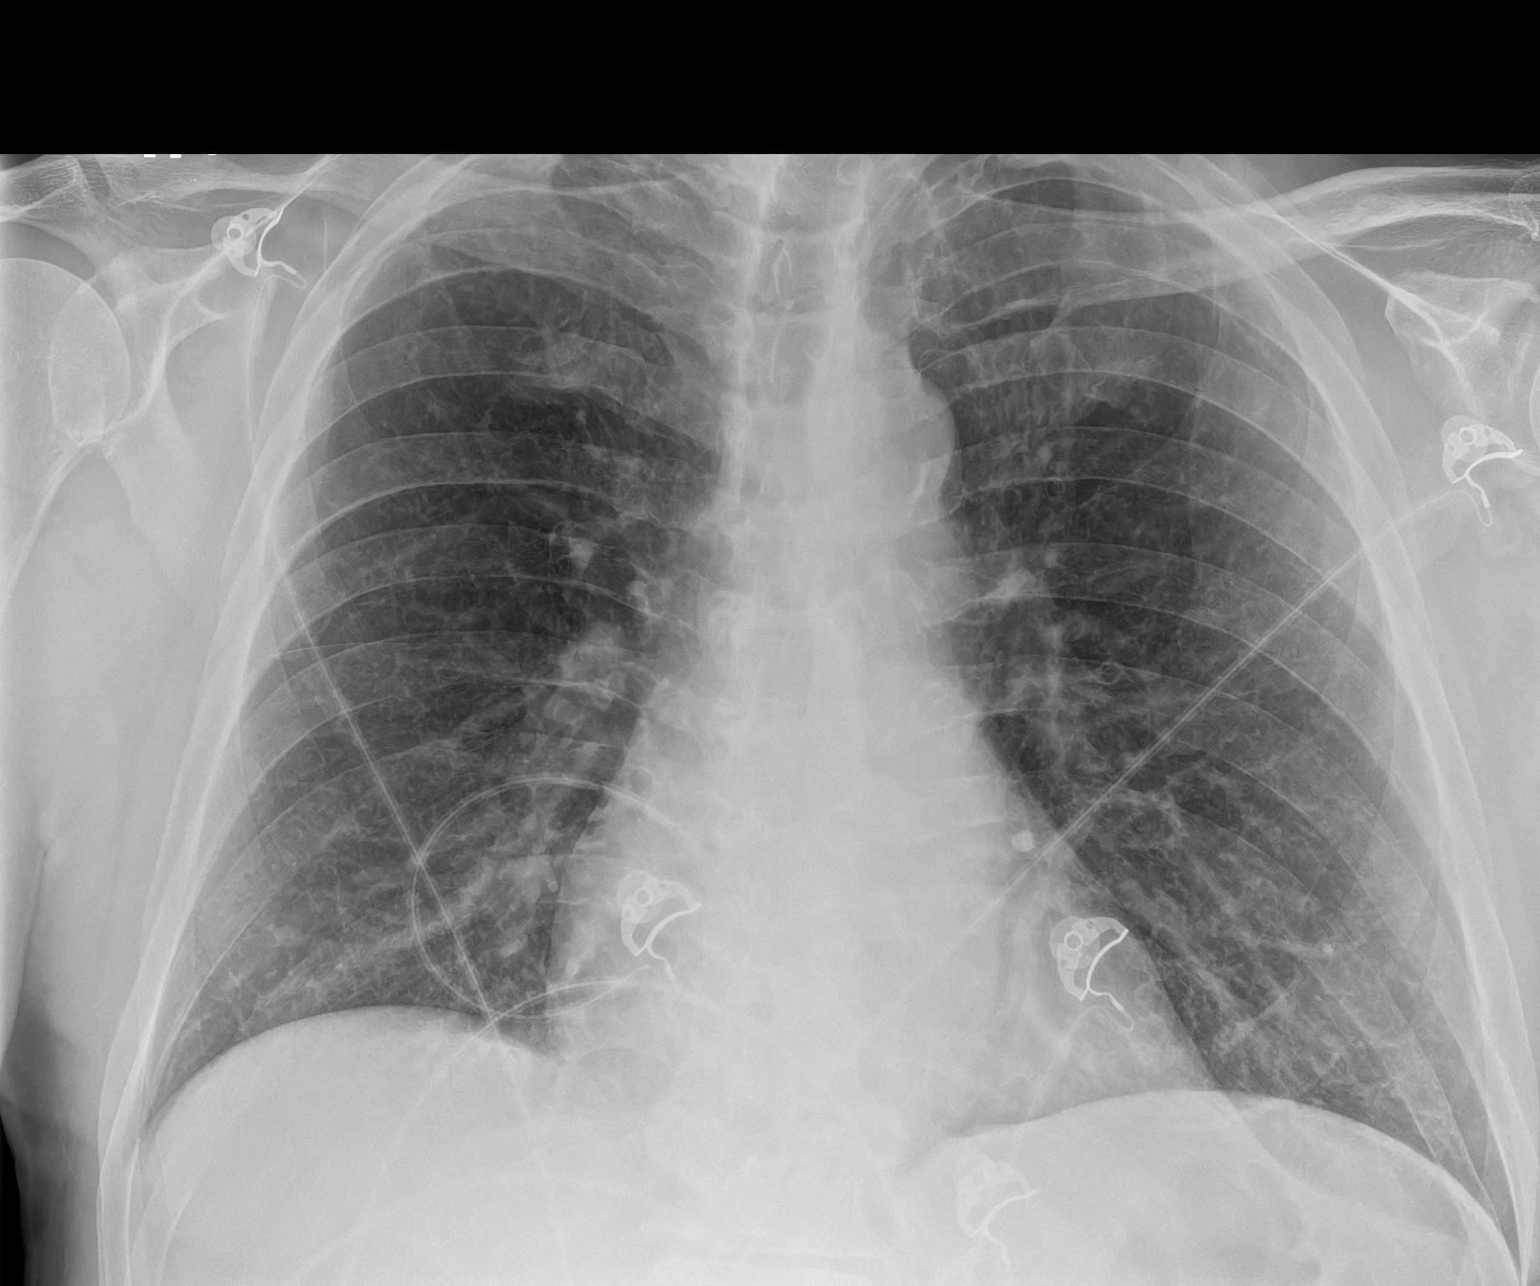

[1 of 1 positions shown; findings below may reference images not displayed]

FINDINGS: Numerous leads and wires project over the chest. Midline trachea.
Normal heart size. Atherosclerosis in the transverse aorta. No
pleural effusion or pneumothorax. Moderate pulmonary interstitial
thickening
IMPRESSION: No acute cardiopulmonary disease.

Peribronchial thickening which may relate to chronic bronchitis or
smoking.

## 2024-02-07 ENCOUNTER — Encounter: Payer: Self-pay | Admitting: Family Medicine

## 2024-02-07 ENCOUNTER — Ambulatory Visit: Payer: BC Managed Care – PPO | Admitting: Family Medicine

## 2024-02-07 VITALS — BP 129/80 | HR 74 | Ht 73.0 in | Wt 198.0 lb

## 2024-02-07 DIAGNOSIS — Z1211 Encounter for screening for malignant neoplasm of colon: Secondary | ICD-10-CM

## 2024-02-07 DIAGNOSIS — Z0001 Encounter for general adult medical examination with abnormal findings: Secondary | ICD-10-CM | POA: Diagnosis not present

## 2024-02-07 DIAGNOSIS — F17210 Nicotine dependence, cigarettes, uncomplicated: Secondary | ICD-10-CM | POA: Diagnosis not present

## 2024-02-07 DIAGNOSIS — Z0000 Encounter for general adult medical examination without abnormal findings: Secondary | ICD-10-CM

## 2024-02-07 LAB — LIPID PANEL

## 2024-02-07 NOTE — Progress Notes (Signed)
 BP 129/80   Pulse 74   Ht 6\' 1"  (1.854 m)   Wt 198 lb (89.8 kg)   SpO2 96%   BMI 26.12 kg/m    Subjective:   Patient ID: Samuel Herman, male    DOB: 1961/05/09, 63 y.o.   MRN: [id number]  HPI: Samuel Herman is a 63 y.o. male presenting on 02/07/2024 for Medical Management of Chronic Issues (CPE)   HPI Physical exam Patient denies any chest pain, shortness of breath, headaches or vision issues, abdominal complaints, diarrhea, nausea, vomiting, or joint issues.  For repeat lung cancer screening.  He is working on quitting smoking.  He says overall healthwise is feeling pretty good.  He has been trying to reduce his smoking and is down to less than a pack per day.  Smoke a little bit of marijuana as well.  Relevant past medical, surgical, family and social history reviewed and updated as indicated. Interim medical history since our last visit reviewed. Allergies and medications reviewed and updated.  Review of Systems  Constitutional:  Negative for chills and fever.  HENT:  Negative for ear pain and tinnitus.   Eyes:  Negative for pain and visual disturbance.  Respiratory:  Negative for cough, shortness of breath and wheezing.   Cardiovascular:  Negative for chest pain, palpitations and leg swelling.  Gastrointestinal:  Negative for abdominal pain, blood in stool, constipation and diarrhea.  Genitourinary:  Negative for dysuria and hematuria.  Musculoskeletal:  Negative for back pain, gait problem and myalgias.  Skin:  Negative for rash.  Neurological:  Negative for dizziness, weakness and headaches.  Psychiatric/Behavioral:  Negative for suicidal ideas.   All other systems reviewed and are negative.   Per HPI unless specifically indicated above   Allergies as of 02/07/2024       Reactions   Other Other (See Comments)   There was an antibiotic that caused hives about 20 years ago.unsure of the name.        Medication List        Accurate as of February 07, 2024 10:24 AM. If you have any questions, ask your nurse or doctor.          cetirizine 10 MG tablet Commonly known as: ZyrTEC Allergy Take 1 tablet (10 mg total) by mouth daily.   fluticasone 50 MCG/ACT nasal spray Commonly known as: FLONASE Place 2 sprays into both nostrils daily.         Objective:   BP 129/80   Pulse 74   Ht 6\' 1"  (1.854 m)   Wt 198 lb (89.8 kg)   SpO2 96%   BMI 26.12 kg/m   Wt Readings from Last 3 Encounters:  02/07/24 198 lb (89.8 kg)  02/05/23 209 lb (94.8 kg)  03/17/22 208 lb 6.4 oz (94.5 kg)    Physical Exam Vitals reviewed.  Constitutional:      General: He is not in acute distress.    Appearance: He is well-developed. He is not diaphoretic.  HENT:     Right Ear: External ear normal.     Left Ear: External ear normal.     Nose: Nose normal.     Mouth/Throat:     Pharynx: No oropharyngeal exudate.  Eyes:     General: No scleral icterus.    Conjunctiva/sclera: Conjunctivae normal.  Neck:     Thyroid: No thyromegaly.  Cardiovascular:     Rate and Rhythm: Normal rate and regular rhythm.     Heart  sounds: Normal heart sounds. No murmur heard. Pulmonary:     Effort: Pulmonary effort is normal. No respiratory distress.     Breath sounds: Normal breath sounds. No wheezing.  Abdominal:     General: Bowel sounds are normal. There is no distension.     Palpations: Abdomen is soft.     Tenderness: There is no abdominal tenderness. There is no guarding or rebound.  Musculoskeletal:        General: No swelling. Normal range of motion.     Cervical back: Neck supple.  Lymphadenopathy:     Cervical: No cervical adenopathy.  Skin:    General: Skin is warm and dry.     Findings: No rash.  Neurological:     Mental Status: He is alert and oriented to person, place, and time.     Coordination: Coordination normal.  Psychiatric:        Behavior: Behavior normal.       Assessment & Plan:   Problem List Items Addressed This Visit    None Visit Diagnoses       Physical exam    -  Primary   Relevant Orders   CBC with Differential/Platelet   CMP14+EGFR   Lipid panel   PSA, total and free     Colon cancer screening       Relevant Orders   Cologuard     Smokes less than 1 pack a day with greater than 30 pack year history       Relevant Orders   Ambulatory Referral Lung Cancer Screening [facility]     Patient seems to be doing well, will do lung cancer screening will do blood work today and sent for [employer].  Follow up plan: Return in about 1 year (around 02/06/2025), or if symptoms worsen or fail to improve, for physical exam.  Counseling provided for all of the vaccine components Orders Placed This Encounter  Procedures   CBC with Differential/Platelet   CMP14+EGFR   Lipid panel   PSA, total and free   Cologuard   Ambulatory Referral Lung Cancer Screening [facility]    Arville Care, MD [facility] 02/07/2024, 10:24 AM

## 2024-02-08 LAB — LIPID PANEL
Cholesterol, Total: 118 mg/dL (ref 100–199)
HDL: 40 mg/dL (ref >39)
LDL CALC COMMENT:: 3 ratio (ref 0.0–5.0)
LDL Chol Calc (NIH): 64 mg/dL (ref 0–99)
Triglycerides: 67 mg/dL (ref 0–149)
VLDL Cholesterol Cal: 14 mg/dL (ref 5–40)

## 2024-02-08 LAB — PSA, TOTAL AND FREE
PSA, Free Pct: 58.9 %
PSA, Free: 0.53 ng/mL
Prostate Specific Ag, Serum: 0.9 ng/mL (ref 0.0–4.0)

## 2024-02-08 LAB — CBC WITH DIFFERENTIAL/PLATELET
Basophils Absolute: 0 10*3/uL (ref 0.0–0.2)
Basos: 1 %
EOS (ABSOLUTE): 0.2 10*3/uL (ref 0.0–0.4)
Eos: 3 %
Hematocrit: 43.7 % (ref 37.5–51.0)
Hemoglobin: 14.9 g/dL (ref 13.0–17.7)
Immature Grans (Abs): 0 10*3/uL (ref 0.0–0.1)
Immature Granulocytes: 1 %
Lymphocytes Absolute: 1.4 10*3/uL (ref 0.7–3.1)
Lymphs: 20 %
MCH: 33 pg (ref 26.6–33.0)
MCHC: 34.1 g/dL (ref 31.5–35.7)
MCV: 97 fL (ref 79–97)
Monocytes Absolute: 0.5 10*3/uL (ref 0.1–0.9)
Monocytes: 7 %
Neutrophils Absolute: 5 10*3/uL (ref 1.4–7.0)
Neutrophils: 68 %
Platelets: 234 10*3/uL (ref 150–450)
RBC: 4.51 x10E6/uL (ref 4.14–5.80)
RDW: 12.3 % (ref 11.6–15.4)
WBC: 7.2 10*3/uL (ref 3.4–10.8)

## 2024-02-08 LAB — CMP14+EGFR
ALT: 21 IU/L (ref 0–44)
AST: 28 IU/L (ref 0–40)
Albumin: 4.4 g/dL (ref 3.9–4.9)
Alkaline Phosphatase: 65 IU/L (ref 44–121)
BUN/Creatinine Ratio: 14 (ref 10–24)
BUN: 13 mg/dL (ref 8–27)
Bilirubin Total: 0.7 mg/dL (ref 0.0–1.2)
CO2: 21 mmol/L (ref 20–29)
Calcium: 10.1 mg/dL (ref 8.6–10.2)
Chloride: 104 mmol/L (ref 96–106)
Creatinine, Ser: 0.94 mg/dL (ref 0.76–1.27)
Globulin, Total: 2.4 g/dL (ref 1.5–4.5)
Glucose: 96 mg/dL (ref 70–99)
Potassium: 4.6 mmol/L (ref 3.5–5.2)
Sodium: 139 mmol/L (ref 134–144)
Total Protein: 6.8 g/dL (ref 6.0–8.5)
eGFR: 91 mL/min/{1.73_m2} (ref >59)

## 2024-02-14 ENCOUNTER — Encounter: Payer: Self-pay | Admitting: Family Medicine

## 2024-11-30 ENCOUNTER — Ambulatory Visit

## 2024-12-01 ENCOUNTER — Ambulatory Visit

## 2024-12-01 ENCOUNTER — Encounter: Payer: Self-pay | Admitting: Family Medicine

## 2024-12-01 VITALS — BP 116/76 | HR 66 | Temp 97.7°F | Ht 73.0 in | Wt 190.2 lb

## 2024-12-01 DIAGNOSIS — Z720 Tobacco use: Secondary | ICD-10-CM | POA: Diagnosis not present

## 2024-12-01 DIAGNOSIS — K121 Other forms of stomatitis: Secondary | ICD-10-CM | POA: Diagnosis not present

## 2024-12-01 DIAGNOSIS — H9202 Otalgia, left ear: Secondary | ICD-10-CM

## 2024-12-01 MED ORDER — AMOXICILLIN-POT CLAVULANATE 875-125 MG PO TABS: 1.0000 | ORAL_TABLET | Freq: Two times a day (BID) | ORAL | 0 refills | Status: DC

## 2024-12-01 MED ORDER — TRIAMCINOLONE ACETONIDE 0.1 % MT PSTE: PASTE | OROMUCOSAL | 0 refills | Status: DC

## 2024-12-01 NOTE — Progress Notes (Signed)
 "  Subjective: CC: Ear pain PCP: Dettinger, Fonda LABOR, MD YEP:Ijcpi Samuel Herman is a 63 y.o. male presenting to clinic today for:  Patient reports she has had a couple week history of left-sided ear pain.  It seemed to be more prominent whenever it was really cold outside but still comes and goes.  His main concern today is that he has a spot on the roof of his mouth that is tender, particularly with eating.  He ate a chip in that area of the mouth and subsequently has a lesion now that his wife is really worried about.  He admits that he is a tobacco user and smokes 1 pack/day and has done so for quite some time.  Does not report any unplanned weight loss, night sweats.   ROS: Per HPI  Allergies[1] Past Medical History:  Diagnosis Date   Atrial fibrillation (HCC)    DCCV 01/2009, not on ASA or antiarrythmic   Tobacco abuse    Current Medications[2] Social History   Socioeconomic History   Marital status: Married    Spouse name: Not on file   Number of children: Not on file   Years of education: Not on file   Highest education level: Not on file  Occupational History   Occupation: insurance risk surveyor  Tobacco Use   Smoking status: Every Day    Current packs/day: 1.00    Average packs/day: 1 pack/day for 40.0 years (40.0 ttl pk-yrs)    Types: Cigarettes   Smokeless tobacco: Never   Tobacco comments:    Pack daily 03/17/22  Vaping Use   Vaping status: Never Used  Substance and Sexual Activity   Alcohol use: Yes    Alcohol/week: 1.0 - 2.0 standard drink of alcohol    Types: 1 - 2 Cans of beer per week    Comment: 1-2 beers once every two weeks 03/17/22   Drug use: Yes    Types: Marijuana    Comment: occasionally, mushrooms   Sexual activity: Yes    Comment: married 34 years  Other Topics Concern   Not on file  Social History Narrative   Not on file   Social Drivers of Health   Tobacco Use: High Risk (12/01/2024)   Patient History    Smoking Tobacco Use: Every Day     Smokeless Tobacco Use: Never    Passive Exposure: Not on file  Financial Resource Strain: Not on file  Food Insecurity: Not on file  Transportation Needs: Not on file  Physical Activity: Not on file  Stress: Not on file  Social Connections: Not on file  Intimate Partner Violence: Not on file  Depression (PHQ2-9): Low Risk (12/01/2024)   Depression (PHQ2-9)    PHQ-2 Score: 0  Alcohol Screen: Not on file  Housing: Not on file  Utilities: Not on file  Health Literacy: Not on file   Family History  Problem Relation Age of Onset   Heart attack Father 24       deceased   Cancer Mother        breast    Objective: Office vital signs reviewed. BP 116/76   Pulse 66   Temp 97.7 F (36.5 C)   Ht 6' 1 (1.854 m)   Wt 190 lb 4 oz (86.3 kg)   SpO2 97%   BMI 25.10 kg/m   Physical Examination:  General: Awake, alert, well nourished, No acute distress HEENT: TMs intact bilaterally but dulled light reflex on the left compared to the right.  Scant cerumen in the external auditory canal on the left.  Rounded ulceration noted on the left upper roof of mouth just above the molar, see photo below       Assessment/ Plan: 63 y.o. male   Ulcer of mouth due to trauma - Plan: amoxicillin-clavulanate (AUGMENTIN) 875-125 MG tablet, triamcinolone (KENALOG) 0.1 % paste  Otalgia, left - Plan: amoxicillin-clavulanate (AUGMENTIN) 875-125 MG tablet  Tobacco use   I suspect that this is an ulcer of the mouth due to trauma but we discussed differential diagnoses including cancerous etiology given tobacco use disorder.  Augmentin sent to cover for any infection and triamcinolone applied to the affected area twice daily for 5 days.  Discussed ongoing salt water gargles.  Advised to start reducing/eliminating tobacco use as this can further his symptomology.  Discussed findings with PCP and recommendations for 2-week follow-up   Everest Hacking M Sherryn Pollino, DO [facility] [phone]     [1]  Allergies Allergen Reactions   Other Other (See Comments)    There was an antibiotic that caused hives about 20 years ago.unsure of the name.  [2]  Current Outpatient Medications:    amoxicillin-clavulanate (AUGMENTIN) 875-125 MG tablet, Take 1 tablet by mouth 2 (two) times daily., Disp: 20 tablet, Rfl: 0   triamcinolone (KENALOG) 0.1 % paste, Apply to the affected area twice daily x5 days, Disp: 5 g, Rfl: 0   cetirizine  (ZYRTEC  ALLERGY) 10 MG tablet, Take 1 tablet (10 mg total) by mouth daily. (Patient not taking: Reported on 12/01/2024), Disp: 90 tablet, Rfl: 1   fluticasone  (FLONASE ) 50 MCG/ACT nasal spray, Place 2 sprays into both nostrils daily. (Patient not taking: Reported on 12/01/2024), Disp: 16 g, Rfl: 6  "

## 2024-12-25 ENCOUNTER — Encounter: Payer: Self-pay | Admitting: Family Medicine

## 2024-12-25 ENCOUNTER — Ambulatory Visit (INDEPENDENT_AMBULATORY_CARE_PROVIDER_SITE_OTHER): Admitting: Family Medicine

## 2024-12-25 VITALS — BP 131/78 | HR 60 | Ht 73.0 in | Wt 196.0 lb

## 2024-12-25 DIAGNOSIS — K121 Other forms of stomatitis: Secondary | ICD-10-CM | POA: Diagnosis not present

## 2024-12-25 DIAGNOSIS — H9202 Otalgia, left ear: Secondary | ICD-10-CM | POA: Diagnosis not present

## 2024-12-25 DIAGNOSIS — K1370 Unspecified lesions of oral mucosa: Secondary | ICD-10-CM | POA: Diagnosis not present

## 2024-12-25 MED ORDER — AMOXICILLIN-POT CLAVULANATE 875-125 MG PO TABS: 1.0000 | ORAL_TABLET | Freq: Two times a day (BID) | ORAL | 0 refills | Status: AC

## 2024-12-25 NOTE — Progress Notes (Signed)
 "  BP 131/78   Pulse 60   Ht 6' 1 (1.854 m)   Wt 196 lb (88.9 kg)   SpO2 98%   BMI 25.86 kg/m    Subjective:   Patient ID: Samuel Herman, male    DOB: 1961/05/29, 63 y.o.   MRN: [id number]  HPI: Samuel Herman is a 64 y.o. male presenting on 12/25/2024 for Mouth Lesions (Roof of mouth. Present 6w. Completed ATB last Monday. Some help.)   Discussed the use of AI scribe software for clinical note transcription with the patient, who gave verbal consent to proceed.  History of Present Illness   Samuel Herman is a 64 year old male who presents with a non-healing sore in his mouth.  Oral ulcer - Non-healing sore in the mouth for approximately six weeks - Onset occurred around the time of a dental cleaning; no issues identified at that visit and sore was not present at that time - Painful, especially with certain foods such as ketchup or ranch, which cause a stinging sensation on the tongue - Drinking water does not cause discomfort, but eating does - Sore has increased in size, changing from a circular to an oval shape - Occasional pain radiating to the eye - No improvement with salt water gargles three times daily or use of a topical salve - Completed a ten-day course of antibiotics (Augmentin ); initially no improvement, but some difference noted after stopping antibiotics - Currently taking Augmentin  with food without gastrointestinal side effects - Occasional use of ibuprofen for inflammation - History of a similar oral lesion in the past that resulted in jaw swelling and was treated as an abscess  Tobacco use - Smoker          Relevant past medical, surgical, family and social history reviewed and updated as indicated. Interim medical history since our last visit reviewed. Allergies and medications reviewed and updated.  Review of Systems  Constitutional:  Negative for chills and fever.  HENT:  Positive for mouth sores.   Eyes:  Negative for visual disturbance.   Respiratory:  Negative for shortness of breath and wheezing.   Cardiovascular:  Negative for chest pain and leg swelling.  Musculoskeletal:  Negative for back pain and gait problem.  Skin:  Negative for rash.  Neurological:  Negative for dizziness and light-headedness.  All other systems reviewed and are negative.   Per HPI unless specifically indicated above   Allergies as of 12/25/2024       Reactions   Other Other (See Comments)   There was an antibiotic that caused hives about 20 years ago.unsure of the name.        Medication List        Accurate as of December 25, 2024 11:44 AM. If you have any questions, ask your nurse or doctor.          STOP taking these medications    cetirizine  10 MG tablet Commonly known as: ZyrTEC  Allergy Stopped by: Fonda Levins, MD   fluticasone  50 MCG/ACT nasal spray Commonly known as: FLONASE  Stopped by: Fonda Levins, MD   triamcinolone  0.1 % paste Commonly known as: KENALOG  Stopped by: Fonda Levins, MD       TAKE these medications    amoxicillin -clavulanate 875-125 MG tablet Commonly known as: AUGMENTIN  Take 1 tablet by mouth 2 (two) times daily.         Objective:   BP 131/78   Pulse 60   Ht 6' 1 (1.854 m)  Wt 196 lb (88.9 kg)   SpO2 98%   BMI 25.86 kg/m   Wt Readings from Last 3 Encounters:  12/25/24 196 lb (88.9 kg)  12/01/24 190 lb 4 oz (86.3 kg)  02/07/24 198 lb (89.8 kg)    Physical Exam Vitals and nursing note reviewed.    Physical Exam   HEENT: Oral lesion is tender to palpation and appears oval-shaped.     Oral ulcer open on his left upper palate just medial to his last molar    Assessment & Plan:   Problem List Items Addressed This Visit   None Visit Diagnoses       Oral mucosal lesion    -  Primary   Relevant Orders   Ambulatory referral to Oral Maxillofacial Surgery     Ulcer of mouth due to trauma       Relevant Medications   amoxicillin -clavulanate (AUGMENTIN )  875-125 MG tablet     Otalgia, left       Relevant Medications   amoxicillin -clavulanate (AUGMENTIN ) 875-125 MG tablet         Non-healing oral mucosal lesion, rule out malignancy Lesion present for six weeks, painful, increased in size. Differential includes malignancy, abscess, or benign conditions. Biopsy needed to rule out malignancy. - Ordered biopsy of oral mucosal lesion. - Referred to ENT or maxillofacial surgeon for biopsy. - Prescribed Augmentin  to prevent infection. - Advised salt water rinses three times a day. - Instructed to keep phone handy for urgent referral appointment scheduling.          Follow up plan: Return if symptoms worsen or fail to improve.  Counseling provided for all of the vaccine components Orders Placed This Encounter  Procedures   Ambulatory referral to Oral Maxillofacial Surgery    Fonda Levins, MD [facility] 12/25/2024, 11:44 AM     "

## 2024-12-28 ENCOUNTER — Telehealth: Payer: Self-pay

## 2024-12-28 NOTE — Telephone Encounter (Signed)
 Referral sent to Latest Notification 12/25/2600:39:43 PM Francella Copping MReferred To Provider Delivery Method  Clinical Attachment Action  Event Queued - Document Generating Fax Number  [phone]  Latest Notification Today10:32:00 AM Francella Copping MReferred To Provider Delivery Method  Clinical Attachment Action  Fax Generated Fax Number  [phone]

## 2024-12-28 NOTE — Telephone Encounter (Signed)
 Copied from CRM #[id number]. Topic: General - Other >> Dec 28, 2024  9:44 AM Donna BRAVO wrote: Reason for CRM: [place] asking to have referral and imaging of mouth  Fax [phone]    Scheduled 01/02/25

## 2025-02-07 ENCOUNTER — Encounter: Payer: BC Managed Care – PPO | Admitting: Family Medicine
